# Patient Record
Sex: Male | Born: 1945 | Race: White | Hispanic: No | Marital: Married | Smoking: Current every day smoker
Health system: Southern US, Community
[De-identification: ages and names within clinical notes are randomized; demographics above are authoritative.]

## PROBLEM LIST (undated history)

## (undated) DIAGNOSIS — I2510 Atherosclerotic heart disease of native coronary artery without angina pectoris: Secondary | ICD-10-CM

## (undated) DIAGNOSIS — E1149 Type 2 diabetes mellitus with other diabetic neurological complication: Secondary | ICD-10-CM

## (undated) DIAGNOSIS — J301 Allergic rhinitis due to pollen: Secondary | ICD-10-CM

## (undated) DIAGNOSIS — J449 Chronic obstructive pulmonary disease, unspecified: Secondary | ICD-10-CM

## (undated) DIAGNOSIS — E785 Hyperlipidemia, unspecified: Secondary | ICD-10-CM

## (undated) DIAGNOSIS — I10 Essential (primary) hypertension: Secondary | ICD-10-CM

## (undated) DIAGNOSIS — K219 Gastro-esophageal reflux disease without esophagitis: Secondary | ICD-10-CM

## (undated) HISTORY — DX: Essential (primary) hypertension: I10

## (undated) HISTORY — DX: Hyperlipidemia, unspecified: E78.5

## (undated) HISTORY — PX: BACK SURGERY: SHX140

## (undated) HISTORY — DX: Chronic obstructive pulmonary disease, unspecified: J44.9

## (undated) HISTORY — DX: Atherosclerotic heart disease of native coronary artery without angina pectoris: I25.10

## (undated) HISTORY — DX: Allergic rhinitis due to pollen: J30.1

## (undated) HISTORY — DX: Type 2 diabetes mellitus with other diabetic neurological complication: E11.49

## (undated) HISTORY — DX: Gastro-esophageal reflux disease without esophagitis: K21.9

## (undated) HISTORY — PX: HERNIA REPAIR: SHX51

---

## 2011-04-23 LAB — HM DIABETES EYE EXAM

## 2012-04-05 ENCOUNTER — Ambulatory Visit (INDEPENDENT_AMBULATORY_CARE_PROVIDER_SITE_OTHER): Payer: Medicare Other | Admitting: Internal Medicine

## 2012-04-05 ENCOUNTER — Encounter: Payer: Self-pay | Admitting: Internal Medicine

## 2012-04-05 VITALS — BP 122/80 | HR 89 | Temp 98.0°F | Ht 66.0 in | Wt 203.0 lb

## 2012-04-05 DIAGNOSIS — F17200 Nicotine dependence, unspecified, uncomplicated: Secondary | ICD-10-CM

## 2012-04-05 DIAGNOSIS — J449 Chronic obstructive pulmonary disease, unspecified: Secondary | ICD-10-CM

## 2012-04-05 DIAGNOSIS — I2510 Atherosclerotic heart disease of native coronary artery without angina pectoris: Secondary | ICD-10-CM | POA: Insufficient documentation

## 2012-04-05 DIAGNOSIS — E1149 Type 2 diabetes mellitus with other diabetic neurological complication: Secondary | ICD-10-CM

## 2012-04-05 DIAGNOSIS — J301 Allergic rhinitis due to pollen: Secondary | ICD-10-CM | POA: Insufficient documentation

## 2012-04-05 DIAGNOSIS — E785 Hyperlipidemia, unspecified: Secondary | ICD-10-CM

## 2012-04-05 DIAGNOSIS — E1140 Type 2 diabetes mellitus with diabetic neuropathy, unspecified: Secondary | ICD-10-CM | POA: Insufficient documentation

## 2012-04-05 DIAGNOSIS — K219 Gastro-esophageal reflux disease without esophagitis: Secondary | ICD-10-CM

## 2012-04-05 DIAGNOSIS — I219 Acute myocardial infarction, unspecified: Secondary | ICD-10-CM

## 2012-04-05 DIAGNOSIS — I10 Essential (primary) hypertension: Secondary | ICD-10-CM

## 2012-04-05 LAB — CBC WITH DIFFERENTIAL/PLATELET
Basophils Absolute: 0.1 10*3/uL (ref 0.0–0.1)
Basophils Relative: 0.5 % (ref 0.0–3.0)
Eosinophils Absolute: 0.4 10*3/uL (ref 0.0–0.7)
HCT: 45.2 % (ref 39.0–52.0)
Hemoglobin: 15.5 g/dL (ref 13.0–17.0)
Lymphs Abs: 2.7 10*3/uL (ref 0.7–4.0)
MCHC: 34.2 g/dL (ref 30.0–36.0)
MCV: 95.2 fl (ref 78.0–100.0)
Monocytes Absolute: 0.9 10*3/uL (ref 0.1–1.0)
Neutro Abs: 8.7 10*3/uL — ABNORMAL HIGH (ref 1.4–7.7)
RBC: 4.75 Mil/uL (ref 4.22–5.81)
RDW: 13.2 % (ref 11.5–14.6)

## 2012-04-05 LAB — BASIC METABOLIC PANEL
CO2: 28 mEq/L (ref 19–32)
Chloride: 99 mEq/L (ref 96–112)
Glucose, Bld: 193 mg/dL — ABNORMAL HIGH (ref 70–99)
Sodium: 135 mEq/L (ref 135–145)

## 2012-04-05 LAB — LDL CHOLESTEROL, DIRECT: Direct LDL: 78.8 mg/dL

## 2012-04-05 LAB — HEPATIC FUNCTION PANEL
ALT: 42 U/L (ref 0–53)
Albumin: 4.3 g/dL (ref 3.5–5.2)
Bilirubin, Direct: 0 mg/dL (ref 0.0–0.3)
Total Protein: 8.3 g/dL (ref 6.0–8.3)

## 2012-04-05 LAB — LIPID PANEL
Cholesterol: 150 mg/dL (ref 0–200)
HDL: 32.2 mg/dL — ABNORMAL LOW (ref >39.00)
Triglycerides: 281 mg/dL — ABNORMAL HIGH (ref 0.0–149.0)

## 2012-04-05 LAB — HM DIABETES FOOT EXAM

## 2012-04-05 NOTE — Assessment & Plan Note (Signed)
Counseled about 5 minutes Gave info on 1-800 QUIT NOW

## 2012-04-05 NOTE — Progress Notes (Signed)
Subjective:    Patient ID: Howard Dixon, male    DOB: [birth year], 67 y.o.   MRN: [id number]  HPI Long ago former patient Last seen ~2004 [facility] Dr Anthoney Harada  Has diabetes for 15 years or so Insulin and glipizide added in past few years Checks sugars every morning Highly variable-- 140's- 200 (rarely over 200) Has had a few hypoglycemic reaction but not recently Not as active in the winter--tends to be higher then Not regular going to the gym Intermittent tingling in hands and feet---and some numbness  HTN at least 15 years On meds and doing okay with this  On ranitidine Satisfied with it for his acid reflux  COPD diagnosis Recent prescription for albuterol Doesn't feel he can quit---thinks he smokes more every time he tries to quit Has decreased to 1PPD Info given  Allergies to pollen Not clearly great response to his current meds  Cardiac cath in 2011 Told he has 100% blockage somewhere but not in his medical records Sees cardiologist Is on statin  Current Outpatient Prescriptions on File Prior to Visit  Medication Sig Dispense Refill  . albuterol (PROVENTIL HFA;VENTOLIN HFA) 108 (90 BASE) MCG/ACT inhaler Inhale 2 puffs into the lungs 4 (four) times daily as needed.      . flunisolide (NASAREL) 29 MCG/ACT (0.025%) nasal spray Place 2 sprays into the nose 2 (two) times daily. Dose is for each nostril.      Marland Kitchen glipiZIDE (GLUCOTROL) 10 MG tablet Take 10 mg by mouth 2 (two) times daily before a meal.      . hydrochlorothiazide (HYDRODIURIL) 25 MG tablet Take 12.5 mg by mouth daily.      . insulin glargine (LANTUS) 100 UNIT/ML injection Inject 20 Units into the skin at bedtime.      Marland Kitchen lisinopril (PRINIVIL,ZESTRIL) 40 MG tablet Take 20 mg by mouth daily.      Marland Kitchen loratadine (CLARITIN) 10 MG tablet Take 10 mg by mouth daily.      . metFORMIN (GLUCOPHAGE) 1000 MG tablet Take 1,000 mg by mouth 2 (two) times daily with a meal.      . metoprolol (LOPRESSOR)  50 MG tablet Take 25 mg by mouth 2 (two) times daily.      . ranitidine (ZANTAC) 150 MG capsule Take 150 mg by mouth 2 (two) times daily.      . simvastatin (ZOCOR) 40 MG tablet Take 20 mg by mouth every evening.        Allergies  Allergen Reactions  . Sulfa Antibiotics Rash    Past Medical History  Diagnosis Date  . Allergic rhinitis due to pollen   . Coronary artery disease, occlusive   . Hyperlipidemia   . Hypertension   . Type II or unspecified type diabetes mellitus with neurological manifestations, not stated as uncontrolled(250.60)   . GERD (gastroesophageal reflux disease)     No past surgical history on file.  Family History  Problem Relation Age of Onset  . Diabetes Mother   . Hypertension Mother   . Diabetes Father   . Heart disease Father   . Hypertension Father   . Diabetes Sister   . Hypertension Sister   . Diabetes Brother   . Heart disease Brother   . Hypertension Brother   . Cancer Neg Hx   . Diabetes Brother   . Hypertension Brother   . Diabetes Brother   . Heart disease Brother   . Kidney disease Brother   .  Hypertension Brother     History   Social History  . Marital Status: Married    Spouse Name: N/A    Number of Children: N/A  . Years of Education: N/A   Occupational History  . Truck driver--retired due to medical problems    Social History Main Topics  . Smoking status: Current Every Day Smoker -- 1.0 packs/day    Types: Cigarettes    Start date: 03/22/1962  . Smokeless tobacco: Never Used  . Alcohol Use: No  . Drug Use: No  . Sexually Active: Not on file   Other Topics Concern  . Not on file   Social History Narrative   1 son--in prison for a long timeNo living willNot sure about health care POA---having some marital issues   Review of Systems  Constitutional: Negative for fatigue and unexpected weight change.       Wears seat belt  HENT: Positive for congestion and rhinorrhea. Negative for hearing loss and tinnitus.         Has dentures but doesn't wear them  Eyes: Negative for visual disturbance.       Reading glasses  Respiratory: Positive for cough, shortness of breath and wheezing.   Cardiovascular: Negative for chest pain, palpitations and leg swelling.  Gastrointestinal: Negative for nausea, vomiting and abdominal pain.       Heartburn if he eats greasy food  Genitourinary: Negative for frequency and difficulty urinating.  Musculoskeletal: Positive for back pain. Negative for joint swelling and arthralgias.       Back is better if he is regularly active Naproxen prn  Skin: Negative for rash.       Dry skin  Neurological: Positive for dizziness, light-headedness and numbness. Negative for syncope, weakness and headaches.       Occ dizziness  Hematological: Negative for adenopathy. Bruises/bleeds easily.       Bruises easy on the aspirin  Psychiatric/Behavioral: Positive for sleep disturbance and dysphoric mood. The patient is not nervous/anxious.        Chronic sleep problems--stays up late, naps in day Lots of stress--some intermittent mood issues Wife and step daughter will "push" him       Objective:   Physical Exam  Constitutional: He appears well-developed and well-nourished. No distress.  HENT:  Mouth/Throat: Oropharynx is clear and moist. No oropharyngeal exudate.  Eyes: Conjunctivae normal are normal. Pupils are equal, round, and reactive to light.  Neck: Normal range of motion. Neck supple. No thyromegaly present.  Cardiovascular: Normal rate, regular rhythm, normal heart sounds and intact distal pulses.  Exam reveals no gallop.   No murmur heard. Pulmonary/Chest: Effort normal. No respiratory distress. He has wheezes. He has no rales.       Decreased breath sounds with slight exp wheezing  Not tight though  Abdominal: Soft. There is no tenderness.  Musculoskeletal: He exhibits no edema and no tenderness.  Lymphadenopathy:    He has no cervical adenopathy.  Neurological:        Normal fine touch sensation in plantar feet  Skin: No rash noted. No erythema.       No foot lesions  Psychiatric: He has a normal mood and affect. His behavior is normal.          Assessment & Plan:

## 2012-04-05 NOTE — Assessment & Plan Note (Signed)
Must stop smoking Hasn't had success

## 2012-04-05 NOTE — Assessment & Plan Note (Signed)
Does okay with the ranitidine 

## 2012-04-05 NOTE — Patient Instructions (Signed)
Please get records from [facility]---all tests, labs, notes from 2010 on

## 2012-04-05 NOTE — Assessment & Plan Note (Signed)
Had 100% blockage in 1 vessel [facility]'t think [facility] being [facility] about all [facility] tests Wonders if he should have another stress test  I will check their records Consider cardiology evaluation No symptoms now though Discussed cigarette cessation

## 2012-04-05 NOTE — Assessment & Plan Note (Signed)
Not clear if his control is acceptable Has had some low sugars but doesn't sound overly brittle If >9% will increase insulin Discussed lifestyle

## 2012-04-05 NOTE — Assessment & Plan Note (Signed)
BP Readings from Last 3 Encounters:  04/05/12 122/80   Good control No changes

## 2012-04-05 NOTE — Assessment & Plan Note (Signed)
On statin Will check labs 

## 2012-04-06 ENCOUNTER — Encounter: Payer: Self-pay | Admitting: *Deleted

## 2012-05-16 ENCOUNTER — Encounter: Payer: Self-pay | Admitting: Internal Medicine

## 2012-07-04 ENCOUNTER — Encounter: Payer: Self-pay | Admitting: *Deleted

## 2012-07-04 ENCOUNTER — Encounter: Payer: Self-pay | Admitting: Internal Medicine

## 2012-07-04 ENCOUNTER — Ambulatory Visit (INDEPENDENT_AMBULATORY_CARE_PROVIDER_SITE_OTHER): Payer: Medicare Other | Admitting: Internal Medicine

## 2012-07-04 VITALS — BP 118/70 | HR 85 | Temp 98.0°F | Wt 198.0 lb

## 2012-07-04 DIAGNOSIS — E1149 Type 2 diabetes mellitus with other diabetic neurological complication: Secondary | ICD-10-CM

## 2012-07-04 DIAGNOSIS — I219 Acute myocardial infarction, unspecified: Secondary | ICD-10-CM

## 2012-07-04 DIAGNOSIS — I2510 Atherosclerotic heart disease of native coronary artery without angina pectoris: Secondary | ICD-10-CM

## 2012-07-04 DIAGNOSIS — J449 Chronic obstructive pulmonary disease, unspecified: Secondary | ICD-10-CM

## 2012-07-04 DIAGNOSIS — E785 Hyperlipidemia, unspecified: Secondary | ICD-10-CM

## 2012-07-04 LAB — HEMOGLOBIN A1C: Hgb A1c MFr Bld: 7.9 % — ABNORMAL HIGH (ref 4.6–6.5)

## 2012-07-04 NOTE — Assessment & Plan Note (Signed)
Seems to be stable Discussed getting back to exercising

## 2012-07-04 NOTE — Assessment & Plan Note (Signed)
Statin changed at the [facility]

## 2012-07-04 NOTE — Assessment & Plan Note (Signed)
Lab Results  Component Value Date   HGBA1C 8.4* 04/05/2012   May need increase in lantus if any worse

## 2012-07-04 NOTE — Assessment & Plan Note (Signed)
Chronic cough No dyspnea but not pushing it Just not able to stop smoking

## 2012-07-04 NOTE — Patient Instructions (Signed)
Please get back to the gym. Please eat lunch daily Don't go to sleep in the evening till close to 10PM---this should improve your sleep pattern. It is also good to get up at a consistent time (like 5-6AM if you go to sleep at 10PM)

## 2012-07-04 NOTE — Progress Notes (Signed)
Subjective:    Patient ID: Howard Dixon, male    DOB: July 02, 1945, 67 y.o.   MRN: [id number]  HPI Here for follow up  Did have simvastatin changed to [facility]orvastatin at [facility] Concern about his records---[facility] took [facility] 30% disability from the CAD. Reviewed that he does have CAD  Hasn't gotten back to the gym Weight is down a few pounds  Checks sugars every morning Usually 150-180 Higher when there is temperature variability Some hypoglycemic reactions---down at 60 or 75-----has mild sweats, shakes and weakness (usually during the afternoon but he doesn't really eat lunch------discussed this)  Breathing has been okay Still with regular cough Still smoking--not ready to stop  Current Outpatient Prescriptions on File Prior to Visit  Medication Sig Dispense Refill  . albuterol (PROVENTIL HFA;VENTOLIN HFA) 108 (90 BASE) MCG/ACT inhaler Inhale 2 puffs into the lungs 4 (four) times daily as needed.      Marland Kitchen aspirin 81 MG tablet Take 81 mg by mouth daily.      . BD ULTRA-FINE LANCETS lancets 1 each by Other route. Use as instructed      . Cholecalciferol (VITAMIN D-3) 1000 UNITS CAPS Take 1 capsule by mouth daily.      . flunisolide (NASAREL) 29 MCG/ACT (0.025%) nasal spray Place 2 sprays into the nose 2 (two) times daily. Dose is for each nostril.      Marland Kitchen glipiZIDE (GLUCOTROL) 10 MG tablet Take 10 mg by mouth 2 (two) times daily before a meal.      . glucose blood (PRECISION XTRA TEST STRIPS) test strip 1 each by Other route as needed. Use as instructed      . hydrocerin (EUCERIN) CREA Apply 1 application topically 2 (two) times daily.      . hydrochlorothiazide (HYDRODIURIL) 25 MG tablet Take 12.5 mg by mouth daily.      . insulin glargine (LANTUS) 100 UNIT/ML injection Inject 20 Units into the skin at bedtime.      . Insulin Syringe-Needle U-100 (INSULIN SYRINGE .5CC/31GX5/16") 31G X 5/16" 0.5 ML MISC by Does not apply route.      Marland Kitchen lisinopril (PRINIVIL,ZESTRIL) 40 MG tablet Take 20 mg by  mouth daily.      Marland Kitchen loratadine (CLARITIN) 10 MG tablet Take 10 mg by mouth daily.      . metFORMIN (GLUCOPHAGE) 1000 MG tablet Take 1,000 mg by mouth 2 (two) times daily with a meal.      . metoprolol (LOPRESSOR) 50 MG tablet Take 25 mg by mouth 2 (two) times daily.      . naproxen (NAPROSYN) 500 MG tablet Take 500 mg by mouth 2 (two) times daily with a meal.      . ranitidine (ZANTAC) 150 MG capsule Take 150 mg by mouth 2 (two) times daily.       No current facility-administered medications on file prior to visit.    Allergies  Allergen Reactions  . Sulfa Antibiotics Rash    Past Medical History  Diagnosis Date  . Allergic rhinitis due to pollen   . Coronary artery disease, occlusive   . Hyperlipidemia   . Hypertension   . Type II or unspecified type diabetes mellitus with neurological manifestations, not stated as uncontrolled(250.60)   . GERD (gastroesophageal reflux disease)   . COPD (chronic obstructive pulmonary disease)     No past surgical history on file.  Family History  Problem Relation Age of Onset  . Diabetes Mother   . Hypertension Mother   .  Diabetes Father   . Heart disease Father   . Hypertension Father   . Diabetes Sister   . Hypertension Sister   . Diabetes Brother   . Heart disease Brother   . Hypertension Brother   . Cancer Neg Hx   . Diabetes Brother   . Hypertension Brother   . Diabetes Brother   . Heart disease Brother   . Kidney disease Brother   . Hypertension Brother     History   Social History  . Marital Status: Married    Spouse Name: N/A    Number of Children: N/A  . Years of Education: N/A   Occupational History  . Truck driver--retired due to medical problems    Social History Main Topics  . Smoking status: Current Every Day Smoker -- 1.00 packs/day    Types: Cigarettes    Start date: 03/22/1962  . Smokeless tobacco: Never Used  . Alcohol Use: No  . Drug Use: No  . Sexually Active: Not on file   Other Topics Concern   . Not on file   Social History Narrative   1 son--in prison for a long time      No living will   Not sure about health care POA---having some marital issues     Review of Systems Not eating as much so weight down a few pounds Not sleeping well-- "sleeping crazy since being out of work". Will nap in day occ "needles" like sensation in ankles--no sig pain    Objective:   Physical Exam  Constitutional: He appears well-developed and well-nourished. No distress.  Neck: Normal range of motion. Neck supple. No thyromegaly present.  Cardiovascular: Normal rate, regular rhythm, normal heart sounds and intact distal pulses.  Exam reveals no gallop.   No murmur heard. Pulmonary/Chest: Effort normal. No respiratory distress. He has wheezes. He has no rales.  Decreased breath sounds Not tight Slight exp moan  Musculoskeletal: He exhibits no edema and no tenderness.  Lymphadenopathy:    He has no cervical adenopathy.  Skin:  No foot lesions  Psychiatric: He has a normal mood and affect. His behavior is normal.          Assessment & Plan:

## 2013-01-03 ENCOUNTER — Ambulatory Visit: Payer: Medicare Other | Admitting: Internal Medicine

## 2013-01-16 ENCOUNTER — Encounter: Payer: Self-pay | Admitting: Internal Medicine

## 2013-01-16 ENCOUNTER — Ambulatory Visit (INDEPENDENT_AMBULATORY_CARE_PROVIDER_SITE_OTHER): Payer: Medicare Other | Admitting: Internal Medicine

## 2013-01-16 VITALS — BP 122/80 | HR 66 | Temp 97.9°F | Wt 189.0 lb

## 2013-01-16 DIAGNOSIS — I2510 Atherosclerotic heart disease of native coronary artery without angina pectoris: Secondary | ICD-10-CM

## 2013-01-16 DIAGNOSIS — I219 Acute myocardial infarction, unspecified: Secondary | ICD-10-CM

## 2013-01-16 DIAGNOSIS — E1149 Type 2 diabetes mellitus with other diabetic neurological complication: Secondary | ICD-10-CM

## 2013-01-16 DIAGNOSIS — J449 Chronic obstructive pulmonary disease, unspecified: Secondary | ICD-10-CM

## 2013-01-16 DIAGNOSIS — I10 Essential (primary) hypertension: Secondary | ICD-10-CM

## 2013-01-16 LAB — HEMOGLOBIN A1C: Hgb A1c MFr Bld: 8.7 % — ABNORMAL HIGH (ref 4.6–6.5)

## 2013-01-16 NOTE — Assessment & Plan Note (Signed)
Good exercise tolerance No changes On higher beta blocker now

## 2013-01-16 NOTE — Assessment & Plan Note (Signed)
BP Readings from Last 3 Encounters:  01/16/13 122/80  07/04/12 118/70  04/05/12 122/80   Good control

## 2013-01-16 NOTE — Assessment & Plan Note (Signed)
Mild symptoms Just not willing to stop the cigarettes

## 2013-01-16 NOTE — Progress Notes (Signed)
Subjective:    Patient ID: Howard Dixon, male    DOB: Jul 30, 1945, 67 y.o.   MRN: [id number]  HPI Here for check up  Saw cardiologist at [facility] about 2 weeks ago---raised metoprolol to 100mg  daily No chest pain No palpitations Does note easier DOE--like 2-3 hours of yard work (just takes brief rests but then exhausted) He wasn't sure why she raised it  Checks sugars intermittently Inconsistent due to recent move---brand new home (new trailer) No hypoglycemic reactions lately No sores in feet but occasional pain-- feeling of "needles being stuck in there" Sugars 150-170 Fairly consistent with his meds  Some cough Occ wheezing---not too bad Discussed cigarettes--just not willing to stop  Stomach has been okay On the ranitidine  Current Outpatient Prescriptions on File Prior to Visit  Medication Sig Dispense Refill  . albuterol (PROVENTIL HFA;VENTOLIN HFA) 108 (90 BASE) MCG/ACT inhaler Inhale 2 puffs into the lungs 4 (four) times daily as needed.      Marland Kitchen aspirin 81 MG tablet Take 81 mg by mouth daily.      Marland Kitchen atorvastatin (LIPITOR) 10 MG tablet NOT SURE OF DOSE      . BD ULTRA-FINE LANCETS lancets 1 each by Other route. Use as instructed      . Cholecalciferol (VITAMIN D-3) 1000 UNITS CAPS Take 1 capsule by mouth daily.      . flunisolide (NASAREL) 29 MCG/ACT (0.025%) nasal spray Place 2 sprays into the nose 2 (two) times daily. Dose is for each nostril.      Marland Kitchen glipiZIDE (GLUCOTROL) 10 MG tablet Take 10 mg by mouth 2 (two) times daily before a meal.      . glucose blood (PRECISION XTRA TEST STRIPS) test strip 1 each by Other route as needed. Use as instructed      . hydrocerin (EUCERIN) CREA Apply 1 application topically 2 (two) times daily.      . hydrochlorothiazide (HYDRODIURIL) 25 MG tablet Take 12.5 mg by mouth daily.      . insulin glargine (LANTUS) 100 UNIT/ML injection Inject 20 Units into the skin at bedtime.      . Insulin Syringe-Needle U-100 (INSULIN SYRINGE .5CC/31GX5/16")  31G X 5/16" 0.5 ML MISC by Does not apply route.      Marland Kitchen lisinopril (PRINIVIL,ZESTRIL) 40 MG tablet Take 20 mg by mouth daily.      Marland Kitchen loratadine (CLARITIN) 10 MG tablet Take 10 mg by mouth daily.      . metFORMIN (GLUCOPHAGE) 1000 MG tablet Take 1,000 mg by mouth 2 (two) times daily with a meal.      . metoprolol (LOPRESSOR) 50 MG tablet Take 50 mg by mouth 2 (two) times daily.       . naproxen (NAPROSYN) 500 MG tablet Take 500 mg by mouth 2 (two) times daily with a meal.      . ranitidine (ZANTAC) 150 MG capsule Take 150 mg by mouth 2 (two) times daily.       No current facility-administered medications on file prior to visit.    Allergies  Allergen Reactions  . Sulfa Antibiotics Rash    Past Medical History  Diagnosis Date  . Allergic rhinitis due to pollen   . Coronary artery disease, occlusive   . Hyperlipidemia   . Hypertension   . Type II or unspecified type diabetes mellitus with neurological manifestations, not stated as uncontrolled(250.60)   . GERD (gastroesophageal reflux disease)   . COPD (chronic obstructive pulmonary disease)     No  past surgical history on file.  Family History  Problem Relation Age of Onset  . Diabetes Mother   . Hypertension Mother   . Diabetes Father   . Heart disease Father   . Hypertension Father   . Diabetes Sister   . Hypertension Sister   . Diabetes Brother   . Heart disease Brother   . Hypertension Brother   . Cancer Neg Hx   . Diabetes Brother   . Hypertension Brother   . Diabetes Brother   . Heart disease Brother   . Kidney disease Brother   . Hypertension Brother     History   Social History  . Marital Status: Married    Spouse Name: N/A    Number of Children: N/A  . Years of Education: N/A   Occupational History  . Truck driver--retired due to medical problems    Social History Main Topics  . Smoking status: Current Every Day Smoker -- 1.00 packs/day    Types: Cigarettes    Start date: 03/22/1962  . Smokeless  tobacco: Never Used  . Alcohol Use: No  . Drug Use: No  . Sexual Activity: Not on file   Other Topics Concern  . Not on file   Social History Narrative   1 son--in prison for a long time      No living will   Not sure about health care POA---having some marital issues   Review of Systems Weight is down 9# Chronic sleep problems--will nap in the day Appetite is okay    Objective:   Physical Exam  Constitutional: He appears well-developed and well-nourished. No distress.  Neck: Normal range of motion. Neck supple. No thyromegaly present.  Cardiovascular: Normal rate, regular rhythm, normal heart sounds and intact distal pulses.  Exam reveals no gallop.   No murmur heard. Pulmonary/Chest: Effort normal. No respiratory distress. He has no wheezes. He has no rales.  Decreased breath sounds but clear  Abdominal: Soft. There is no tenderness.  Musculoskeletal: He exhibits no edema and no tenderness.  Lymphadenopathy:    He has no cervical adenopathy.  Skin: No rash noted. No erythema.  No foot lesions  Psychiatric: He has a normal mood and affect. His behavior is normal.          Assessment & Plan:

## 2013-01-16 NOTE — Assessment & Plan Note (Signed)
Will recheck control Doing okay

## 2013-01-18 ENCOUNTER — Encounter: Payer: Self-pay | Admitting: *Deleted

## 2013-02-14 ENCOUNTER — Other Ambulatory Visit: Payer: Medicare Other

## 2013-07-19 ENCOUNTER — Encounter: Payer: Medicare Other | Admitting: Internal Medicine

## 2013-10-01 ENCOUNTER — Telehealth: Payer: Self-pay | Admitting: *Deleted

## 2013-10-01 NOTE — Telephone Encounter (Signed)
Lm on pts vm requesting a call back to schedule DIABETIC BUND[facility] LAB-needing LDL and A1C 

## 2014-01-02 ENCOUNTER — Telehealth: Payer: Self-pay

## 2014-01-02 NOTE — Telephone Encounter (Signed)
Wrong numbers listed.

## 2015-03-31 LAB — FECAL OCCULT BLOOD, GUAIAC: FECAL OCCULT BLD: NEGATIVE

## 2015-04-28 ENCOUNTER — Encounter: Payer: Self-pay | Admitting: Internal Medicine

## 2015-05-11 ENCOUNTER — Emergency Department (HOSPITAL_COMMUNITY)
Admission: EM | Admit: 2015-05-11 | Discharge: 2015-05-11 | Disposition: A | Discharge status: Home / Self Care | Payer: Medicare Other | Attending: Emergency Medicine | Admitting: Emergency Medicine

## 2015-05-11 ENCOUNTER — Encounter (HOSPITAL_COMMUNITY): Payer: Self-pay | Admitting: *Deleted

## 2015-05-11 ENCOUNTER — Emergency Department (HOSPITAL_COMMUNITY): Payer: Medicare Other

## 2015-05-11 DIAGNOSIS — I2510 Atherosclerotic heart disease of native coronary artery without angina pectoris: Secondary | ICD-10-CM | POA: Insufficient documentation

## 2015-05-11 DIAGNOSIS — J449 Chronic obstructive pulmonary disease, unspecified: Secondary | ICD-10-CM | POA: Diagnosis not present

## 2015-05-11 DIAGNOSIS — Z7982 Long term (current) use of aspirin: Secondary | ICD-10-CM | POA: Insufficient documentation

## 2015-05-11 DIAGNOSIS — M25552 Pain in left hip: Secondary | ICD-10-CM | POA: Insufficient documentation

## 2015-05-11 DIAGNOSIS — Z794 Long term (current) use of insulin: Secondary | ICD-10-CM | POA: Insufficient documentation

## 2015-05-11 DIAGNOSIS — R1032 Left lower quadrant pain: Secondary | ICD-10-CM | POA: Insufficient documentation

## 2015-05-11 DIAGNOSIS — Z79899 Other long term (current) drug therapy: Secondary | ICD-10-CM | POA: Insufficient documentation

## 2015-05-11 DIAGNOSIS — F17210 Nicotine dependence, cigarettes, uncomplicated: Secondary | ICD-10-CM | POA: Diagnosis not present

## 2015-05-11 DIAGNOSIS — M545 Low back pain: Secondary | ICD-10-CM | POA: Diagnosis not present

## 2015-05-11 DIAGNOSIS — I10 Essential (primary) hypertension: Secondary | ICD-10-CM | POA: Insufficient documentation

## 2015-05-11 DIAGNOSIS — E1149 Type 2 diabetes mellitus with other diabetic neurological complication: Secondary | ICD-10-CM | POA: Diagnosis not present

## 2015-05-11 DIAGNOSIS — G8929 Other chronic pain: Secondary | ICD-10-CM | POA: Insufficient documentation

## 2015-05-11 DIAGNOSIS — Z7984 Long term (current) use of oral hypoglycemic drugs: Secondary | ICD-10-CM | POA: Diagnosis not present

## 2015-05-11 DIAGNOSIS — K219 Gastro-esophageal reflux disease without esophagitis: Secondary | ICD-10-CM | POA: Insufficient documentation

## 2015-05-11 DIAGNOSIS — E785 Hyperlipidemia, unspecified: Secondary | ICD-10-CM | POA: Diagnosis not present

## 2015-05-11 DIAGNOSIS — Z791 Long term (current) use of non-steroidal anti-inflammatories (NSAID): Secondary | ICD-10-CM | POA: Diagnosis not present

## 2015-05-11 DIAGNOSIS — M79605 Pain in left leg: Secondary | ICD-10-CM | POA: Diagnosis present

## 2015-05-11 LAB — URINE MICROSCOPIC-ADD ON
BACTERIA UA: NONE SEEN
SQUAMOUS EPITHELIAL / LPF: NONE SEEN
WBC, UA: NONE SEEN WBC/hpf (ref 0–5)

## 2015-05-11 LAB — URINALYSIS, ROUTINE W REFLEX MICROSCOPIC
Bilirubin Urine: NEGATIVE
Glucose, UA: NEGATIVE mg/dL
Ketones, ur: NEGATIVE mg/dL
LEUKOCYTES UA: NEGATIVE
NITRITE: NEGATIVE
PROTEIN: NEGATIVE mg/dL
Specific Gravity, Urine: 1.02 (ref 1.005–1.030)
pH: 5 (ref 5.0–8.0)

## 2015-05-11 MED ORDER — ACETAMINOPHEN 500 MG PO TABS: 1000.0000 mg | ORAL_TABLET | Freq: Once | ORAL | Status: DC

## 2015-05-11 NOTE — Discharge Instructions (Signed)
You may take Tylenol (acetaminophen) 1000 milligrams every 6 hours as needed for pain. Please be careful because Percocet has oxycodone and Tylenol in it. You should not take more than 4000 mg Tylenol in a 24-hour period.  Urine today shows small blood but no other sign of infection. Your x-ray showed degenerative changes. I recommend you follow-up with your primary care physician for further management for your pain.   Hip Pain Your hip is the joint between your upper legs and your lower pelvis. The bones, cartilage, tendons, and muscles of your hip joint perform a lot of work each day supporting your body weight and allowing you to move around. Hip pain can range from a minor ache to severe pain in one or both of your hips. Pain may be felt on the inside of the hip joint near the groin, or the outside near the buttocks and upper thigh. You may have swelling or stiffness as well.  HOME CARE INSTRUCTIONS   Take medicines only as directed by your health care provider.  Apply ice to the injured area:  Put ice in a plastic bag.  Place a towel between your skin and the bag.  Leave the ice on for 15-20 minutes at a time, 3-4 times a day.  Keep your leg raised (elevated) when possible to lessen swelling.  Avoid activities that cause pain.  Follow specific exercises as directed by your health care provider.  Sleep with a pillow between your legs on your most comfortable side.  Record how often you have hip pain, the location of the pain, and what it feels like. SEEK MEDICAL CARE IF:   You are unable to put weight on your leg.  Your hip is red or swollen or very tender to touch.  Your pain or swelling continues or worsens after 1 week.  You have increasing difficulty walking.  You have a fever. SEEK IMMEDIATE MEDICAL CARE IF:   You have fallen.  You have a sudden increase in pain and swelling in your hip. MAKE SURE YOU:   Understand these instructions.  Will watch your  condition.  Will get help right away if you are not doing well or get worse.   This information is not intended to replace advice given to you by your health care provider. Make sure you discuss any questions you have with your health care provider.   Document Released: 08/26/2009 Document Revised: 03/29/2014 Document Reviewed: 11/02/2012 Elsevier Interactive Patient Education [employer]

## 2015-05-11 NOTE — ED Notes (Signed)
Pt c/o left groin, left leg, left hip and lower back pain that has been ongoing since the first of the year, pain is worse with sitting for long periods of time, pt states that he is just tired of the pain,

## 2015-05-11 NOTE — ED Provider Notes (Signed)
TIME SEEN: 5:00  CHIEF COMPLAINT: left leg pain, groin pain, back pain  HPI: Pt is a 70 y.o. male history of hypertension, diabetes, hyperlipidemia, COPD who presents to the emergency department with 2-3 months of left lower back pain, left hip and left groin pain. Pain worse after sitting for long periods of time. He has been on Percocet with minimal relief. States that Tylenol however provides good relief for his pain. No history of injury. No numbness, tingling or focal weakness. No dysuria, hematuria. No testicular pain or swelling. States she was seen at the [facility] several weeks ago and had an ultrasound of his "groin" which was normal. No noted history of PE or DVT. No lower extreme swelling or calf tenderness. He denies penile discharge. No history of STD and has not been sexually active in many years.  ROS: See HPI Constitutional: no fever  Eyes: no drainage  ENT: no runny nose   Cardiovascular:  no chest pain  Resp: no SOB  GI: no vomiting GU: no dysuria Integumentary: no rash  Allergy: no hives  Musculoskeletal: no leg swelling  Neurological: no slurred speech ROS otherwise negative  PAST MEDICAL HISTORY/PAST SURGICAL HISTORY:  Past Medical History  Diagnosis Date  . Allergic rhinitis due to pollen   . Coronary artery disease, occlusive   . Hyperlipidemia   . Hypertension   . Type II or unspecified type diabetes mellitus with neurological manifestations, not stated as uncontrolled   . GERD (gastroesophageal reflux disease)   . COPD (chronic obstructive pulmonary disease) (HCC)     MEDICATIONS:  Prior to Admission medications   Medication Sig Start Date End Date Taking? Authorizing Provider  albuterol (PROVENTIL HFA;VENTOLIN HFA) 108 (90 BASE) MCG/ACT inhaler Inhale 2 puffs into the lungs 4 (four) times daily as needed.    Historical Provider, MD  aspirin 81 MG tablet Take 81 mg by mouth daily.    Historical Provider, MD  atorvastatin (LIPITOR) 10 MG tablet NOT SURE  OF DOSE    Historical Provider, MD  BD ULTRA-FINE LANCETS lancets 1 each by Other route. Use as instructed    Historical Provider, MD  Cholecalciferol (VITAMIN D-3) 1000 UNITS CAPS Take 1 capsule by mouth daily.    Historical Provider, MD  flunisolide (NASAREL) 29 MCG/ACT (0.025%) nasal spray Place 2 sprays into the nose 2 (two) times daily. Dose is for each nostril.    Historical Provider, MD  glipiZIDE (GLUCOTROL) 10 MG tablet Take 10 mg by mouth 2 (two) times daily before a meal.    Historical Provider, MD  glucose blood (PRECISION XTRA TEST STRIPS) test strip 1 each by Other route as needed. Use as instructed    Historical Provider, MD  hydrocerin (EUCERIN) CREA Apply 1 application topically 2 (two) times daily.    Historical Provider, MD  hydrochlorothiazide (HYDRODIURIL) 25 MG tablet Take 12.5 mg by mouth daily.    Historical Provider, MD  insulin glargine (LANTUS) 100 UNIT/ML injection Inject 20 Units into the skin at bedtime.    Historical Provider, MD  Insulin Syringe-Needle U-100 (INSULIN SYRINGE .5CC/31GX5/16") 31G X 5/16" 0.5 ML MISC by Does not apply route.    Historical Provider, MD  lisinopril (PRINIVIL,ZESTRIL) 40 MG tablet Take 20 mg by mouth daily.    Historical Provider, MD  loratadine (CLARITIN) 10 MG tablet Take 10 mg by mouth daily.    Historical Provider, MD  metFORMIN (GLUCOPHAGE) 1000 MG tablet Take 1,000 mg by mouth 2 (two) times daily with a meal.  Historical Provider, MD  metoprolol (LOPRESSOR) 50 MG tablet Take 50 mg by mouth 2 (two) times daily.     Historical Provider, MD  naproxen (NAPROSYN) 500 MG tablet Take 500 mg by mouth 2 (two) times daily with a meal.    Historical Provider, MD  ranitidine (ZANTAC) 150 MG capsule Take 150 mg by mouth 2 (two) times daily.    Historical Provider, MD    ALLERGIES:  Allergies  Allergen Reactions  . Sulfa Antibiotics Rash    SOCIAL HISTORY:  Social History  Substance Use Topics  . Smoking status: Current Every Day Smoker  -- 1.00 packs/day    Types: Cigarettes    Start date: 03/22/1962  . Smokeless tobacco: Never Used  . Alcohol Use: No    FAMILY HISTORY: Family History  Problem Relation Age of Onset  . Diabetes Mother   . Hypertension Mother   . Diabetes Father   . Heart disease Father   . Hypertension Father   . Diabetes Sister   . Hypertension Sister   . Diabetes Brother   . Heart disease Brother   . Hypertension Brother   . Cancer Neg Hx   . Diabetes Brother   . Hypertension Brother   . Diabetes Brother   . Heart disease Brother   . Kidney disease Brother   . Hypertension Brother     EXAM: BP 130/93 mmHg  Pulse 86  Temp(Src) 97.7 F (36.5 C) (Oral)  Resp 22  Ht  (1.676 m)  Wt 196 lb (88.905 kg)  BMI 31.65 kg/m2  SpO2 99% CONSTITUTIONAL: Alert and oriented and responds appropriately to questions. Well-appearing; well-nourished,elderly and in no distress, afebrile HEAD: Normocephalic EYES: Conjunctivae clear, PERRL ENT: normal nose; no rhinorrhea; moist mucous membranes; pharynx without lesions noted NECK: Supple, no meningismus, no LAD  CARD: RRR; S1 and S2 appreciated; no murmurs, no clicks, no rubs, no gallops RESP: Normal chest excursion without splinting or tachypnea; breath sounds clear and equal bilaterally; no wheezes, no rhonchi, no rales, no hypoxia or respiratory distress, speaking full sentences ABD/GI: Normal bowel sounds; non-distended; soft, non-tender, no rebound, no guarding, no peritoneal signs GU:  Normal external genitalia, circumcised male, normal penile shaft, no blood or discharge at the urethral meatus, no testicular masses or tenderness on exam, no scrotal masses or swelling, no hernias appreciated, 2+ femoral pulses bilaterally; no perineal erythema, warmth, subcutaneous air or crepitus; no high riding testicle, normal bilateral cremasteric reflex; no tenderness in the inguinal area, no inguinal lymphadenopathy appreciated BACK:  The back appears normal  and is minimally tender to palpation over the left lumbar paraspinal musculature without step-off or deformity, there is no CVA tenderness EXT: patient is tender to palpation of the left lateral hip has full range of motion in this joint and leg length discrepancy. No joint effusion, erythema or warmth. No bony deformity.  Normal ROM in all joints; otherwise extremities are non-tender to palpation; no edema; normal capillary refill; no cyanosis, no calf tenderness or swelling    SKIN: Normal color for age and race; warm NEURO: Moves all extremities equally, sensation to light touch intact diffusely, cranial nerves II through XII intact; normal gait, no saddle anesthesia PSYCH: The patient's mood and manner are appropriate. Grooming and personal hygiene are appropriate.  MEDICAL DECISION MAKING: Pt here with left hip and left lower back pain. Suspect this is musculoskeletal in nature, arthritic changes. Doubt spinal stenosis, cauda equina, epidural abscess or hematoma, transverse myelitis, discitis. His GU exam  is normal. Nothing to suggest testicular torsion, epididymitis. No history of STDs and he is not sexually active. Will obtain urinalysis as patient is a poor historian but doubt that this is a UTI. Doubt STI.  Reports he took pain medication prior to arrival and declines any pain medication at this time. He does have outpatient follow-up. Doubt there is any emergent, life-threatening condition present.  ED PROGRESS: Urine shows small hemoglobin but no other sign of infection. Again doubt that this is kidney stone or infection. X-ray showed degenerative changes with no acute abnormality. Discussed these findings with patient. Recommended close outpatient follow-up. He states Tylenol has helped his pain significantly the past. Discussed with him that he continues 650 to 1,000 mg every 6 hours pain. Have advised Britta Mccreedy to not take Percocet at the same time as they both have acetaminophen. Discussed with  him that he should not take more than 4000 mg of acetaminophen a day. Recommended alternating heat and ice as needed. Discussed return precautions. He verbalizes understanding and is comfortable with this plan.     Layla Maw Brennley Curtice, DO 05/11/15 [phone]

## 2015-07-23 ENCOUNTER — Telehealth: Payer: Self-pay | Admitting: Internal Medicine

## 2015-07-23 NOTE — Telephone Encounter (Signed)
Spoke to the patient. Advised him he would have to be seen and we would need the records of treatment. Advised him the 1st appointment I could see with Dr Alphonsus SiasLetvak would be May 17 at 8am. [facility]

## 2015-07-23 NOTE — Telephone Encounter (Signed)
I don't mind reviewing the MRI but without an exam, it is hard to tell exactly what is going on. He would probably need to request in writing for them to send it to me/ If he can't even walk, it sounds like he needs to be seen (like an ER--perhaps at the [facility] since his care has been there and the MRI results would be available)

## 2015-07-23 NOTE — Telephone Encounter (Signed)
Patient was last seen October,2014 by Dr.Letvak.  Patient has been going to the [facility].  Patient went to the [facility] for his leg.  Patient thinks it's a slipped disc in his back against a nerve.  Patient said he had an MRI done on 07/17/15 at the [facility].  Patient said he hasn't been notified about the results. Patient left a message with his provider 3 days ago to get the results.  Patient said he's tired of taking pain medications.  [facility].  Patient can't come in for an office visit with Dr.Letvak because he can't put any pressure on his leg. Please advise.

## 2015-07-28 ENCOUNTER — Emergency Department (HOSPITAL_COMMUNITY): Payer: Medicare Other

## 2015-07-28 ENCOUNTER — Observation Stay (HOSPITAL_COMMUNITY)
Admission: EM | Admit: 2015-07-28 | Discharge: 2015-07-30 | Disposition: A | Discharge status: Home / Self Care | Payer: Medicare Other | Attending: Internal Medicine | Admitting: Internal Medicine

## 2015-07-28 ENCOUNTER — Encounter (HOSPITAL_COMMUNITY): Payer: Self-pay | Admitting: *Deleted

## 2015-07-28 ENCOUNTER — Inpatient Hospital Stay (HOSPITAL_COMMUNITY): Payer: Medicare Other

## 2015-07-28 DIAGNOSIS — M6281 Muscle weakness (generalized): Secondary | ICD-10-CM | POA: Diagnosis not present

## 2015-07-28 DIAGNOSIS — M25562 Pain in left knee: Secondary | ICD-10-CM | POA: Diagnosis not present

## 2015-07-28 DIAGNOSIS — F17210 Nicotine dependence, cigarettes, uncomplicated: Secondary | ICD-10-CM | POA: Diagnosis not present

## 2015-07-28 DIAGNOSIS — Z794 Long term (current) use of insulin: Secondary | ICD-10-CM | POA: Diagnosis not present

## 2015-07-28 DIAGNOSIS — M4716 Other spondylosis with myelopathy, lumbar region: Secondary | ICD-10-CM | POA: Diagnosis present

## 2015-07-28 DIAGNOSIS — Z7951 Long term (current) use of inhaled steroids: Secondary | ICD-10-CM | POA: Diagnosis not present

## 2015-07-28 DIAGNOSIS — J449 Chronic obstructive pulmonary disease, unspecified: Secondary | ICD-10-CM | POA: Diagnosis not present

## 2015-07-28 DIAGNOSIS — K219 Gastro-esophageal reflux disease without esophagitis: Secondary | ICD-10-CM | POA: Insufficient documentation

## 2015-07-28 DIAGNOSIS — M4806 Spinal stenosis, lumbar region: Secondary | ICD-10-CM | POA: Insufficient documentation

## 2015-07-28 DIAGNOSIS — I10 Essential (primary) hypertension: Secondary | ICD-10-CM | POA: Diagnosis not present

## 2015-07-28 DIAGNOSIS — R262 Difficulty in walking, not elsewhere classified: Secondary | ICD-10-CM | POA: Diagnosis not present

## 2015-07-28 DIAGNOSIS — G9520 Unspecified cord compression: Secondary | ICD-10-CM | POA: Diagnosis not present

## 2015-07-28 DIAGNOSIS — Z01818 Encounter for other preprocedural examination: Secondary | ICD-10-CM | POA: Diagnosis not present

## 2015-07-28 DIAGNOSIS — G8929 Other chronic pain: Secondary | ICD-10-CM | POA: Insufficient documentation

## 2015-07-28 DIAGNOSIS — M545 Low back pain, unspecified: Secondary | ICD-10-CM

## 2015-07-28 DIAGNOSIS — K8020 Calculus of gallbladder without cholecystitis without obstruction: Secondary | ICD-10-CM | POA: Insufficient documentation

## 2015-07-28 DIAGNOSIS — Z79899 Other long term (current) drug therapy: Secondary | ICD-10-CM | POA: Diagnosis not present

## 2015-07-28 DIAGNOSIS — Z7982 Long term (current) use of aspirin: Secondary | ICD-10-CM | POA: Insufficient documentation

## 2015-07-28 DIAGNOSIS — E785 Hyperlipidemia, unspecified: Secondary | ICD-10-CM | POA: Insufficient documentation

## 2015-07-28 DIAGNOSIS — Z882 Allergy status to sulfonamides status: Secondary | ICD-10-CM | POA: Insufficient documentation

## 2015-07-28 DIAGNOSIS — E1140 Type 2 diabetes mellitus with diabetic neuropathy, unspecified: Secondary | ICD-10-CM | POA: Diagnosis not present

## 2015-07-28 DIAGNOSIS — R1032 Left lower quadrant pain: Secondary | ICD-10-CM | POA: Diagnosis not present

## 2015-07-28 DIAGNOSIS — M5127 Other intervertebral disc displacement, lumbosacral region: Secondary | ICD-10-CM | POA: Insufficient documentation

## 2015-07-28 DIAGNOSIS — I2510 Atherosclerotic heart disease of native coronary artery without angina pectoris: Secondary | ICD-10-CM | POA: Insufficient documentation

## 2015-07-28 DIAGNOSIS — Z419 Encounter for procedure for purposes other than remedying health state, unspecified: Secondary | ICD-10-CM

## 2015-07-28 DIAGNOSIS — I700 Atherosclerosis of aorta: Secondary | ICD-10-CM | POA: Diagnosis not present

## 2015-07-28 DIAGNOSIS — R531 Weakness: Secondary | ICD-10-CM | POA: Insufficient documentation

## 2015-07-28 DIAGNOSIS — J301 Allergic rhinitis due to pollen: Secondary | ICD-10-CM | POA: Insufficient documentation

## 2015-07-28 DIAGNOSIS — E1165 Type 2 diabetes mellitus with hyperglycemia: Secondary | ICD-10-CM | POA: Diagnosis not present

## 2015-07-28 DIAGNOSIS — R29898 Other symptoms and signs involving the musculoskeletal system: Secondary | ICD-10-CM

## 2015-07-28 DIAGNOSIS — M5116 Intervertebral disc disorders with radiculopathy, lumbar region: Secondary | ICD-10-CM | POA: Diagnosis not present

## 2015-07-28 DIAGNOSIS — M4800 Spinal stenosis, site unspecified: Secondary | ICD-10-CM | POA: Diagnosis present

## 2015-07-28 DIAGNOSIS — M25552 Pain in left hip: Secondary | ICD-10-CM | POA: Diagnosis not present

## 2015-07-28 LAB — CBC WITH DIFFERENTIAL/PLATELET
BASOS ABS: 0 10*3/uL (ref 0.0–0.1)
BASOS PCT: 0 %
EOS ABS: 0.1 10*3/uL (ref 0.0–0.7)
EOS PCT: 1 %
HCT: 44.4 % (ref 39.0–52.0)
Hemoglobin: 15.3 g/dL (ref 13.0–17.0)
LYMPHS ABS: 2.6 10*3/uL (ref 0.7–4.0)
Lymphocytes Relative: 28 %
MCH: 31.8 pg (ref 26.0–34.0)
MCHC: 34.5 g/dL (ref 30.0–36.0)
MCV: 92.3 fL (ref 78.0–100.0)
Monocytes Absolute: 0.7 10*3/uL (ref 0.1–1.0)
Monocytes Relative: 7 %
NEUTROS PCT: 64 %
Neutro Abs: 6.1 10*3/uL (ref 1.7–7.7)
PLATELETS: 214 10*3/uL (ref 150–400)
RBC: 4.81 MIL/uL (ref 4.22–5.81)
RDW: 13.2 % (ref 11.5–15.5)
WBC: 9.6 10*3/uL (ref 4.0–10.5)

## 2015-07-28 LAB — COMPREHENSIVE METABOLIC PANEL
ALK PHOS: 63 U/L (ref 38–126)
ALT: 38 U/L (ref 17–63)
AST: 31 U/L (ref 15–41)
Albumin: 3.8 g/dL (ref 3.5–5.0)
Anion gap: 12 (ref 5–15)
BILIRUBIN TOTAL: 1.4 mg/dL — AB (ref 0.3–1.2)
BUN: 11 mg/dL (ref 6–20)
CALCIUM: 10 mg/dL (ref 8.9–10.3)
CO2: 27 mmol/L (ref 22–32)
CREATININE: 1.42 mg/dL — AB (ref 0.61–1.24)
Chloride: 94 mmol/L — ABNORMAL LOW (ref 101–111)
GFR, EST AFRICAN AMERICAN: 57 mL/min — AB (ref >60)
GFR, EST NON AFRICAN AMERICAN: 49 mL/min — AB (ref >60)
Glucose, Bld: 221 mg/dL — ABNORMAL HIGH (ref 65–99)
Potassium: 4.5 mmol/L (ref 3.5–5.1)
Sodium: 133 mmol/L — ABNORMAL LOW (ref 135–145)
TOTAL PROTEIN: 7.8 g/dL (ref 6.5–8.1)

## 2015-07-28 LAB — URINALYSIS, ROUTINE W REFLEX MICROSCOPIC
Bilirubin Urine: NEGATIVE
Glucose, UA: 250 mg/dL — AB
Ketones, ur: NEGATIVE mg/dL
Leukocytes, UA: NEGATIVE
NITRITE: NEGATIVE
PROTEIN: NEGATIVE mg/dL
Specific Gravity, Urine: 1.012 (ref 1.005–1.030)
pH: 5.5 (ref 5.0–8.0)

## 2015-07-28 LAB — GLUCOSE, CAPILLARY
GLUCOSE-CAPILLARY: 146 mg/dL — AB (ref 65–99)
GLUCOSE-CAPILLARY: 248 mg/dL — AB (ref 65–99)

## 2015-07-28 LAB — URINE MICROSCOPIC-ADD ON: SQUAMOUS EPITHELIAL / LPF: NONE SEEN

## 2015-07-28 LAB — TYPE AND SCREEN
ABO/RH(D): O POS
ANTIBODY SCREEN: NEGATIVE

## 2015-07-28 LAB — ABO/RH: ABO/RH(D): O POS

## 2015-07-28 MED ORDER — HYDRALAZINE HCL 20 MG/ML IJ SOLN: 10.0000 mg | INTRAMUSCULAR | Status: DC | PRN

## 2015-07-28 MED ORDER — METOPROLOL TARTRATE 50 MG PO TABS
50.0000 mg | ORAL_TABLET | Freq: Two times a day (BID) | ORAL | Status: DC
  Administered 2015-07-28 – 2015-07-30 (×4): 50 mg via ORAL
  Filled 2015-07-28 (×4): qty 1

## 2015-07-28 MED ORDER — ACETAMINOPHEN 650 MG RE SUPP: 650.0000 mg | Freq: Four times a day (QID) | RECTAL | Status: DC | PRN

## 2015-07-28 MED ORDER — FAMOTIDINE 20 MG PO TABS
20.0000 mg | ORAL_TABLET | Freq: Two times a day (BID) | ORAL | Status: DC
  Administered 2015-07-28 – 2015-07-30 (×4): 20 mg via ORAL
  Filled 2015-07-28 (×4): qty 1

## 2015-07-28 MED ORDER — VITAMIN D-3 25 MCG (1000 UT) PO CAPS: 1.0000 | ORAL_CAPSULE | Freq: Every day | ORAL | Status: DC

## 2015-07-28 MED ORDER — LORAZEPAM 2 MG/ML IJ SOLN
0.5000 mg | Freq: Once | INTRAMUSCULAR | Status: AC
  Administered 2015-07-28: 0.5 mg via INTRAVENOUS
  Filled 2015-07-28: qty 1

## 2015-07-28 MED ORDER — ZOLPIDEM TARTRATE 5 MG PO TABS: 5.0000 mg | ORAL_TABLET | Freq: Every evening | ORAL | Status: DC | PRN

## 2015-07-28 MED ORDER — VITAMIN D 1000 UNITS PO TABS
1000.0000 [IU] | ORAL_TABLET | Freq: Every day | ORAL | Status: DC
  Administered 2015-07-29 – 2015-07-30 (×2): 1000 [IU] via ORAL
  Filled 2015-07-28 (×2): qty 1

## 2015-07-28 MED ORDER — MENTHOL 3 MG MT LOZG: 1.0000 | LOZENGE | OROMUCOSAL | Status: DC | PRN

## 2015-07-28 MED ORDER — FLEET ENEMA 7-19 GM/118ML RE ENEM: 1.0000 | ENEMA | Freq: Once | RECTAL | Status: DC | PRN

## 2015-07-28 MED ORDER — IOPAMIDOL (ISOVUE-300) INJECTION 61%
INTRAVENOUS | Status: AC
  Administered 2015-07-28: 100 mL
  Filled 2015-07-28: qty 100

## 2015-07-28 MED ORDER — SODIUM CHLORIDE 0.9% FLUSH: 3.0000 mL | INTRAVENOUS | Status: DC | PRN

## 2015-07-28 MED ORDER — BISACODYL 5 MG PO TBEC: 5.0000 mg | DELAYED_RELEASE_TABLET | Freq: Every day | ORAL | Status: DC | PRN

## 2015-07-28 MED ORDER — DOCUSATE SODIUM 100 MG PO CAPS
100.0000 mg | ORAL_CAPSULE | Freq: Two times a day (BID) | ORAL | Status: DC
  Administered 2015-07-28 – 2015-07-30 (×4): 100 mg via ORAL
  Filled 2015-07-28 (×4): qty 1

## 2015-07-28 MED ORDER — LORATADINE 10 MG PO TABS
10.0000 mg | ORAL_TABLET | Freq: Every day | ORAL | Status: DC
  Administered 2015-07-28 – 2015-07-30 (×3): 10 mg via ORAL
  Filled 2015-07-28 (×3): qty 1

## 2015-07-28 MED ORDER — ONDANSETRON HCL 4 MG/2ML IJ SOLN: 4.0000 mg | INTRAMUSCULAR | Status: DC | PRN

## 2015-07-28 MED ORDER — NICOTINE 21 MG/24HR TD PT24
21.0000 mg | MEDICATED_PATCH | Freq: Once | TRANSDERMAL | Status: DC
  Filled 2015-07-28: qty 1

## 2015-07-28 MED ORDER — METHOCARBAMOL 1000 MG/10ML IJ SOLN
500.0000 mg | Freq: Four times a day (QID) | INTRAVENOUS | Status: DC | PRN
  Filled 2015-07-28: qty 5

## 2015-07-28 MED ORDER — METHOCARBAMOL 500 MG PO TABS: 500.0000 mg | ORAL_TABLET | Freq: Four times a day (QID) | ORAL | Status: DC | PRN

## 2015-07-28 MED ORDER — CEFAZOLIN SODIUM-DEXTROSE 2-4 GM/100ML-% IV SOLN: 2.0000 g | INTRAVENOUS | Status: DC

## 2015-07-28 MED ORDER — HYDROMORPHONE HCL 1 MG/ML IJ SOLN: 0.5000 mg | INTRAMUSCULAR | Status: DC | PRN

## 2015-07-28 MED ORDER — ATORVASTATIN CALCIUM 10 MG PO TABS
10.0000 mg | ORAL_TABLET | Freq: Every day | ORAL | Status: DC
  Administered 2015-07-29: 10 mg via ORAL
  Filled 2015-07-28 (×2): qty 1

## 2015-07-28 MED ORDER — ALBUTEROL SULFATE (2.5 MG/3ML) 0.083% IN NEBU: 2.5000 mg | INHALATION_SOLUTION | RESPIRATORY_TRACT | Status: DC | PRN

## 2015-07-28 MED ORDER — ALBUTEROL SULFATE HFA 108 (90 BASE) MCG/ACT IN AERS: 2.0000 | INHALATION_SPRAY | Freq: Four times a day (QID) | RESPIRATORY_TRACT | Status: DC | PRN

## 2015-07-28 MED ORDER — PANTOPRAZOLE SODIUM 40 MG IV SOLR
40.0000 mg | Freq: Every day | INTRAVENOUS | Status: DC
  Administered 2015-07-28 – 2015-07-29 (×2): 40 mg via INTRAVENOUS
  Filled 2015-07-28 (×2): qty 40

## 2015-07-28 MED ORDER — SENNA 8.6 MG PO TABS
1.0000 | ORAL_TABLET | Freq: Two times a day (BID) | ORAL | Status: DC
Start: 2015-07-28 — End: 2015-07-30
  Administered 2015-07-28 – 2015-07-30 (×4): 8.6 mg via ORAL
  Filled 2015-07-28 (×4): qty 1

## 2015-07-28 MED ORDER — INSULIN ASPART 100 UNIT/ML ~~LOC~~ SOLN
0.0000 [IU] | SUBCUTANEOUS | Status: DC
Start: 2015-07-28 — End: 2015-07-30
  Administered 2015-07-28: 1 [IU] via SUBCUTANEOUS
  Administered 2015-07-29 (×3): 2 [IU] via SUBCUTANEOUS
  Administered 2015-07-29: 3 [IU] via SUBCUTANEOUS
  Administered 2015-07-29 – 2015-07-30 (×2): 2 [IU] via SUBCUTANEOUS
  Administered 2015-07-30 (×2): 3 [IU] via SUBCUTANEOUS

## 2015-07-28 MED ORDER — SODIUM CHLORIDE 0.9 % IV BOLUS (SEPSIS)
1000.0000 mL | Freq: Once | INTRAVENOUS | Status: AC
  Administered 2015-07-28: 1000 mL via INTRAVENOUS

## 2015-07-28 MED ORDER — ONDANSETRON HCL 4 MG/2ML IJ SOLN
4.0000 mg | Freq: Once | INTRAMUSCULAR | Status: AC
  Administered 2015-07-28: 4 mg via INTRAVENOUS
  Filled 2015-07-28: qty 2

## 2015-07-28 MED ORDER — OXYCODONE-ACETAMINOPHEN 5-325 MG PO TABS: 1.0000 | ORAL_TABLET | ORAL | Status: DC | PRN

## 2015-07-28 MED ORDER — HYDROCODONE-ACETAMINOPHEN 5-325 MG PO TABS: 1.0000 | ORAL_TABLET | ORAL | Status: DC | PRN

## 2015-07-28 MED ORDER — MORPHINE SULFATE (PF) 4 MG/ML IV SOLN
4.0000 mg | Freq: Once | INTRAVENOUS | Status: AC
  Administered 2015-07-28: 4 mg via INTRAVENOUS
  Filled 2015-07-28: qty 1

## 2015-07-28 MED ORDER — FLUTICASONE PROPIONATE 50 MCG/ACT NA SUSP
2.0000 | Freq: Every day | NASAL | Status: DC | PRN
  Filled 2015-07-28: qty 16

## 2015-07-28 MED ORDER — ONDANSETRON HCL 4 MG PO TABS: 4.0000 mg | ORAL_TABLET | Freq: Four times a day (QID) | ORAL | Status: DC | PRN

## 2015-07-28 MED ORDER — SODIUM CHLORIDE 0.9% FLUSH
3.0000 mL | Freq: Two times a day (BID) | INTRAVENOUS | Status: DC
  Administered 2015-07-28 – 2015-07-30 (×4): 3 mL via INTRAVENOUS

## 2015-07-28 MED ORDER — NAPROXEN 250 MG PO TABS: 500.0000 mg | ORAL_TABLET | Freq: Two times a day (BID) | ORAL | Status: DC

## 2015-07-28 MED ORDER — ACETAMINOPHEN 325 MG PO TABS: 650.0000 mg | ORAL_TABLET | Freq: Four times a day (QID) | ORAL | Status: DC | PRN

## 2015-07-28 MED ORDER — PHENOL 1.4 % MT LIQD: 1.0000 | OROMUCOSAL | Status: DC | PRN

## 2015-07-28 MED ORDER — ONDANSETRON HCL 4 MG/2ML IJ SOLN: 4.0000 mg | Freq: Four times a day (QID) | INTRAMUSCULAR | Status: DC | PRN

## 2015-07-28 NOTE — ED Provider Notes (Signed)
5:18 PM BP 125/70 mmHg  Pulse 90  Temp(Src) 97.5 F (36.4 C) (Oral)  Resp 18  SpO2 98%'  Patient taken in sign out from [place]. Patient has a history of chronic back pain, CAD, COPD, diabetes. He has had significantly worsening back pain. Her last 3 days. [facility], which was negative. Complains of shooting pain in the left abdomen that wraps around the front of the groin. Pain was treated here, but he continues to have weakness in the left leg. He has been using a wheelchair at home. His scans are negative. However, due to weakness. He is getting an MRI of the back.  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 Although pain is better controlled and the patient is able to ambulate, he has some weakness. Dr. Bevely Palmeritty asks for admit the patient for surgical intervention. I have spoken with dr. Lars Massonkarakandy who will admit the patient. Images reviewed with Dr. Silverio LayYAO who agrees.  Arthor Captainbigail Quinnie Barcelo, PA-C 08/02/15 [id number]  Richardean Canalavid H Yao, MD 08/02/15 [phone]

## 2015-07-28 NOTE — ED Notes (Signed)
Ambulated Pt with Assistance in the hallway. Pt stated his left lower leg felt weak a little while walking. Pt was a little unsteady but with assistance was fine. Pt hooked back up to Monitor and in bed.

## 2015-07-28 NOTE — H&P (Signed)
History and Physical    Howard Dixon WUJ:[id number] DOB: 07-03-1945 DOA: 07/28/2015  Referring MD/NP/PA: Ms.[email or web address]. PCP: [facility]  Outpatient Specialists: None. Patient coming from: Home.  Chief Complaint: Low back pain.  HPI: Howard Dixon is a 70 y.o. male with medical history significant of diabetes mellitus type 2, chronic kidney disease, hypertension, hyperlipidemia and CAD presents to the ER because of worsening low back pain radiating to his left leg. Patient states his low back pain started around January of this year which has gradually worsened over the last few months. Denies any trauma or fall. Over the last 1 week patient is finding it increasingly difficult to walk. Denies any incontinence of urine or bowels. MRI of the lumbosacral spine shows disc extrusion at L2-L3 level with spinal stenosis. On-call neurosurgeon Dr.Ditty has been consulted and patient is scheduled for surgery in the morning.  ED Course: Started on pain relief medications. Patient had MRI and CT of the abdomen. X-rays of the left knee and hip.  Review of Systems: As per HPI otherwise 10 point review of systems negative.    Past Medical History  Diagnosis Date  . Allergic rhinitis due to pollen   . Coronary artery disease, occlusive   . Hyperlipidemia   . Hypertension   . Type II or unspecified type diabetes mellitus with neurological manifestations, not stated as uncontrolled   . GERD (gastroesophageal reflux disease)   . COPD (chronic obstructive pulmonary disease) [facility])     Past Surgical History  Procedure Laterality Date  . Hernia repair    . Back surgery       reports that he has been smoking Cigarettes.  He started smoking about 53 years ago. He has been smoking about 1.00 pack per day. He has never used smokeless tobacco. He reports that he does not drink alcohol or use illicit drugs.  Allergies  Allergen Reactions  . Sulfa Antibiotics Rash    Family History    Problem Relation Age of Onset  . Diabetes Mother   . Hypertension Mother   . Diabetes Father   . Heart disease Father   . Hypertension Father   . Diabetes Sister   . Hypertension Sister   . Diabetes Brother   . Heart disease Brother   . Hypertension Brother   . Cancer Neg Hx   . Diabetes Brother   . Hypertension Brother   . Diabetes Brother   . Heart disease Brother   . Kidney disease Brother   . Hypertension Brother     Prior to Admission medications   Medication Sig Start Date End Date Taking? Authorizing Provider  albuterol (PROVENTIL HFA;VENTOLIN HFA) 108 (90 BASE) MCG/ACT inhaler Inhale 2 puffs into the lungs 4 (four) times daily as needed.   Yes Historical Provider, MD  aspirin 81 MG tablet Take 81 mg by mouth daily.   Yes Historical Provider, MD  atorvastatin (LIPITOR) 10 MG tablet Take 10 mg by mouth daily at 6 PM. NOT SURE OF DOSE   Yes Historical Provider, MD  BD ULTRA-FINE LANCETS lancets 1 each by Other route. Use as instructed   Yes Historical Provider, MD  Cholecalciferol (VITAMIN D-3) 1000 UNITS CAPS Take 1 capsule by mouth daily.   Yes Historical Provider, MD  flunisolide (NASAREL) 29 MCG/ACT (0.025%) nasal spray Place 2 sprays into the nose 2 (two) times daily. Dose is for each nostril.   Yes Historical Provider, MD  glipiZIDE (GLUCOTROL) 10 MG tablet Take 10  mg by mouth 2 (two) times daily before a meal.   Yes Historical Provider, MD  glucose blood (PRECISION XTRA TEST STRIPS) test strip 1 each by Other route as needed. Use as instructed   Yes Historical Provider, MD  hydrocerin (EUCERIN) CREA Apply 1 application topically 2 (two) times daily.   Yes Historical Provider, MD  hydrochlorothiazide (HYDRODIURIL) 25 MG tablet Take 12.5 mg by mouth daily.   Yes Historical Provider, MD  insulin glargine (LANTUS) 100 UNIT/ML injection Inject 20 Units into the skin at bedtime.   Yes Historical Provider, MD  Insulin Syringe-Needle U-100 (INSULIN SYRINGE .5CC/31GX5/16") 31G X  5/16" 0.5 ML MISC by Does not apply route.   Yes Historical Provider, MD  lisinopril (PRINIVIL,ZESTRIL) 40 MG tablet Take 20 mg by mouth daily.   Yes Historical Provider, MD  loratadine (CLARITIN) 10 MG tablet Take 10 mg by mouth daily.   Yes Historical Provider, MD  metoprolol (LOPRESSOR) 50 MG tablet Take 50 mg by mouth 2 (two) times daily.    Yes Historical Provider, MD  naproxen (NAPROSYN) 500 MG tablet Take 500 mg by mouth 2 (two) times daily with a meal.   Yes Historical Provider, MD  ranitidine (ZANTAC) 150 MG capsule Take 150 mg by mouth 2 (two) times daily.   Yes Historical Provider, MD  metFORMIN (GLUCOPHAGE) 1000 MG tablet Take 1,000 mg by mouth 2 (two) times daily with a meal.    Historical Provider, MD    Physical Exam: Filed Vitals:   07/28/15 1804 07/28/15 1815 07/28/15 2004 07/28/15 2015  BP: 140/90 143/90 118/65 111/79  Pulse: 91 117 91 95  Temp: 97.6 F (36.4 C)     TempSrc: Oral     Resp: 22  20 21   SpO2: 99% 99% 95% 95%      Constitutional: Not in distress. Filed Vitals:   07/28/15 1804 07/28/15 1815 07/28/15 2004 07/28/15 2015  BP: 140/90 143/90 118/65 111/79  Pulse: 91 117 91 95  Temp: 97.6 F (36.4 C)     TempSrc: Oral     Resp: 22  20 21   SpO2: 99% 99% 95% 95%   Eyes: Anicteric no pallor. ENMT: No discharge from the ears eyes nose or mouth. Neck: No mass felt. No JVD appreciated. Respiratory: No rhonchi or crepitations. Cardiovascular: S1 and S2 heard. Abdomen: Soft nontender bowel sounds present. Musculoskeletal: Pain on moving left leg. Skin: No rash. Neurologic: Alert awake oriented to time place and person. Moves all extremities. Psychiatric: Appears normal.   Labs on Admission: I have personally reviewed following labs and imaging studies  CBC:  Recent Labs Lab 07/28/15 1114  WBC 9.6  NEUTROABS 6.1  HGB 15.3  HCT 44.4  MCV 92.3  PLT 214   Basic Metabolic Panel:  Recent Labs Lab 07/28/15 1114  NA 133*  K 4.5  CL 94*  CO2  27  GLUCOSE 221*  BUN 11  CREATININE 1.42*  CALCIUM 10.0   GFR: CrCl cannot be calculated (Unknown ideal weight.). Liver Function Tests:  Recent Labs Lab 07/28/15 1114  AST 31  ALT 38  ALKPHOS 63  BILITOT 1.4*  PROT 7.8  ALBUMIN 3.8   No results for input(s): LIPASE, AMYLASE in the last 168 hours. No results for input(s): AMMONIA in the last 168 hours. Coagulation Profile: No results for input(s): INR, PROTIME in the last 168 hours. Cardiac Enzymes: No results for input(s): CKTOTAL, CKMB, CKMBINDEX, TROPONINI in the last 168 hours. BNP (last 3 results) No results for  input(s): PROBNP in the last 8760 hours. HbA1C: No results for input(s): HGBA1C in the last 72 hours. CBG: No results for input(s): GLUCAP in the last 168 hours. Lipid Profile: No results for input(s): CHOL, HDL, LDLCALC, TRIG, CHOLHDL, LDLDIRECT in the last 72 hours. Thyroid Function Tests: No results for input(s): TSH, T4TOTAL, FREET4, T3FREE, THYROIDAB in the last 72 hours. Anemia Panel: No results for input(s): VITAMINB12, FOLATE, FERRITIN, TIBC, IRON, RETICCTPCT in the last 72 hours. Urine analysis:    Component Value Date/Time   COLORURINE YELLOW 07/28/2015 1020   APPEARANCEUR CLEAR 07/28/2015 1020   LABSPEC 1.012 07/28/2015 1020   PHURINE 5.5 07/28/2015 1020   GLUCOSEU 250* 07/28/2015 1020   HGBUR SMALL* 07/28/2015 1020   BILIRUBINUR NEGATIVE 07/28/2015 1020   KETONESUR NEGATIVE 07/28/2015 1020   PROTEINUR NEGATIVE 07/28/2015 1020   NITRITE NEGATIVE 07/28/2015 1020   LEUKOCYTESUR NEGATIVE 07/28/2015 1020   Sepsis Labs: (procalcitonin:4,lacticidven:4) )No results found for this or any previous visit (from the past 240 hour(s)).   Radiological Exams on Admission: Dg Chest 2 View  07/28/2015  CLINICAL DATA:  Pre-op for lumbar micro discectomy. EXAM: CHEST  2 VIEW COMPARISON:  Limited correlation made with abdominal CT 07/28/2015. FINDINGS: The heart size and mediastinal contours are  normal. The lungs are clear. There is mild central airway thickening. There is no pleural effusion or pneumothorax. No acute osseous findings are identified. IMPRESSION: Mild central airway thickening attributed to smoking. No acute cardiopulmonary process. Electronically Signed   By: Carey Bullocks M.D.   On: 07/28/2015 19:54   Mr Lumbar Spine Wo Contrast  07/28/2015  CLINICAL DATA:  Low back and groin pain. Decreased ability to ambulate. EXAM: MRI LUMBAR SPINE WITHOUT CONTRAST TECHNIQUE: Multiplanar, multisequence MR imaging of the lumbar spine was performed. No intravenous contrast was administered. COMPARISON:  CT abdomen and pelvis 07/28/2015 FINDINGS: There are bilateral L5 pars defects with 11 mm anterolisthesis of L5 on S1. There is severe disc space height loss at L5-S1 with osseous fusion across the disc space. Slight retrolisthesis is noted of L2 on L3 and L3 on L4. Severe disc space narrowing is present at L2-3 with prominent degenerative endplate marrow changes including moderate edema. There is mild disc space narrowing at L3-4. Vertebral body heights are preserved. The conus medullaris is normal in signal and terminates at T12. There is mild nonspecific paraspinal muscular edema bilaterally in the lower lumbar spine. L1-2:  Negative. L2-3: Circumferential disc bulging with superimposed, large left central disc extrusion which demonstrates caudal migration in the left lateral recess to the mid L3 vertebral body level. Along with mild facet and ligamentum flavum hypertrophy, the disc bulging and extrusion result in severe spinal stenosis, left greater than right lateral recess stenosis, and mild left neural foraminal narrowing. L3-4: Mild disc bulging, small superimposed right paracentral/subarticular disc extrusion with minimal caudal migration, and mild facet and ligamentum flavum hypertrophy result in moderate spinal stenosis, moderate right and mild left lateral recess stenosis, and mild right  neural foraminal stenosis. L4-5: Minimal leftward disc bulging and mild-to-moderate facet hypertrophy result in minimal left neural foraminal narrowing. No significant spinal stenosis. L5-S1: Listhesis with disc uncovering and disc space height loss result in severe left greater than right neural foraminal stenosis without significant spinal stenosis. IMPRESSION: 1. Large L2-3 disc extrusion with severe spinal stenosis. 2. Moderate spinal stenosis and right greater than left lateral recess stenosis at L3-4. 3. Grade 2 anterolisthesis of L5 on S1 with severe bilateral foraminal stenosis. Electronically Signed  By: Sebastian Ache M.D.   On: 07/28/2015 17:35   Ct Abdomen Pelvis W Contrast  07/28/2015  CLINICAL DATA:  Left lower quadrant and low back pain, chronic. No known injury. Initial encounter. EXAM: CT ABDOMEN AND PELVIS WITH CONTRAST TECHNIQUE: Multidetector CT imaging of the abdomen and pelvis was performed using the standard protocol following bolus administration of intravenous contrast. CONTRAST:  100 ISOVUE-300 IOPAMIDOL (ISOVUE-300) INJECTION 61% COMPARISON:  None. FINDINGS: The lung bases are clear. No pleural or pericardial effusion. Calcific coronary artery disease is identified. A single tiny stone is seen in the neck of the gallbladder. There is no gallbladder wall thickening or surrounding inflammatory change. The liver, adrenal glands, pancreas and spleen appear normal. Tiny hypoattenuating lesion the mid pole of the right kidney is likely a cyst. Mild scarring is seen in the mid and lower pole of the left kidney. Extensive aortoiliac atherosclerosis without aneurysm is identified. The stomach, small and large bowel and appendix appear normal. No lymphadenopathy or fluid is seen. The L5-S1 level is fused with 1 cm anterolisthesis identified. There is marked degenerative disc disease at L3-4 where there is loss of disc space height and endplate spurring. A cystic appearing structure off the medial  and inferior aspect of the left facet joint measures 1.2 cm craniocaudal by 1.2 cm transverse by 1.1 cm AP and has partial rim calcification. It causes marked central canal and left lateral recess narrowing. IMPRESSION: No acute abnormality abdomen or pelvis. Lumbar spondylosis appearing worst at L3-4 there is a large synovial cyst or calcified disc fragment just below the disc interspace in the left lateral recess causing marked central canal stenosis and left lateral recess narrowing. If the patient has radicular symptoms, MRI of the lumbar spine could be used further evaluation. Extensive calcific coronary and aortic atherosclerosis. Single small gallstone without evidence of cholecystitis. Electronically Signed   By: Drusilla Kanner M.D.   On: 07/28/2015 13:42   Dg Knee Complete 4 Views Left  07/28/2015  CLINICAL DATA:  Chronic left hip pain EXAM: LEFT KNEE - COMPLETE 4+ VIEW COMPARISON:  None. FINDINGS: The left knee is located. No acute bone or soft tissue abnormality is present. Diffuse atherosclerotic calcifications are present. IMPRESSION: 1. No acute abnormality of the left knee. 2. Atherosclerosis. Electronically Signed   By: Marin Roberts M.D.   On: 07/28/2015 13:41   Dg Hip Unilat With Pelvis 2-3 Views Left  07/28/2015  CLINICAL DATA:  Chronic left hip and knee pain over the last 5 months. EXAM: DG HIP (WITH OR WITHOUT PELVIS) 2-3V LEFT COMPARISON:  Left hip radiographs 05/11/2015. FINDINGS: Mild degenerative changes are present in the lower lumbar spine. Atherosclerotic calcifications are seen bilaterally. Spells is intact. The left hip is located. No acute bone or soft tissue abnormality is present. IMPRESSION: 1. No acute abnormality or significant interval change. 2. Atherosclerosis. Electronically Signed   By: Marin Roberts M.D.   On: 07/28/2015 13:40    EKG: Independently reviewed. Normal sinus rhythm with PVCs and old inferior infarct.  Assessment/Plan Principal  Problem:   Lumbar disc herniation with radiculopathy Active Problems:   Hypertension   COPD (chronic obstructive pulmonary disease) (HCC)   Spinal stenosis   Type 2 diabetes mellitus with hyperglycemia (HCC)   Low back pain    #1. Disc extrusion at L2-L3 level with spinal stenosis and low back pain - neurosurgery Dr. Bevely Palmer has been counseled and patient is scheduled for surgery in a.m. Patient is at moderate risk for  intermediate risk procedure. We'll keep patient nothing by mouth after midnight. #2. Diabetes mellitus type 2 with hyperglycemia - patient states his Lantus insulin and metformin was recently discontinued 3 weeks ago by his primary care physician since his hemoglobin A1c was around 5.7. At this time patient is only on Glucotrol which will be continued after surgery. For now I have placed patient on sliding scale coverage. #3. Hypertension - we'll continue metoprolol. Hold HCTZ until surgery and restart after surgery. #4. Hyperlipidemia on statins. #5. Chronic kidney disease stage III - follow metabolic panel. #6. History of CAD - denies having any chest pain. Has not had any previous history of CABG or stents placed. Restart aspirin after surgery as per neurosurgery. #7. COPD Presley not wheezing.   DVT prophylaxis: SCDs. Code Status: Full code.  Family Communication: Patient's wife.  Disposition Plan: Possibly rehabilitation.  Consults called: Neurosurgery.  Admission status: Inpatient. MedSurg. Likely stay 3-4 days.    Eduard ClosKAKRAKANDY,Haston Casebolt N. MD [facility] Pager [phone].  If 7PM-7AM, please contact night-coverage [email or web address] Password [email or web address]  07/28/2015, 8:41 PM

## 2015-07-28 NOTE — Progress Notes (Signed)
Patient arrived around 2100 alert and oriented no complaints of pain left leg is not symptomatic at this time. Will continue to monitor.

## 2015-07-28 NOTE — ED Notes (Signed)
Pt reports having hemorrhoids and unable to have bowel movement for several days. Also reports chronic pain to back and groin area. No acute distress noted at triage.

## 2015-07-28 NOTE — ED Notes (Signed)
Patient transported to X-ray 

## 2015-07-28 NOTE — ED Provider Notes (Signed)
CSN: [id number]     Arrival date & time 07/28/15  4098 History   First MD Initiated Contact with Patient 07/28/15 1028     Chief Complaint  Patient presents with  . Constipation     (Consider location/radiation/quality/duration/timing/severity/associated sxs/prior Treatment) HPI   [name] is a 70 y.o. male, with a history of CAD, COPD, DM, and HTN, presenting to the ED with left lower quadrant abdominal pain as well as left lower back and groin pain intermittent since January 2, significantly worse in the last 3 days. Rates his pain at 10/10, shooting in nature, radiating down the left leg. Pt states that over the last week, his pain has continually worsened and he endorses some weakness in the left leg. As a result, pt states he has not been ambulating almost at all for the past week. Takes tylenol every 6 hours with some relief. Also complains of constipation. Last BM was 1 weeks ago. Pt states he can not have a BM because his hemorrhoids have swollen so much he physically can't push the stool through them. Denies fever/chills, night sweats, scrotal/penile swelling, hematochezia/melena, urinary complaints, or any other complaints.   Past Medical History  Diagnosis Date  . Allergic rhinitis due to pollen   . Coronary artery disease, occlusive   . Hyperlipidemia   . Hypertension   . Type II or unspecified type diabetes mellitus with neurological manifestations, not stated as uncontrolled   . GERD (gastroesophageal reflux disease)   . COPD (chronic obstructive pulmonary disease) [facility])    Past Surgical History  Procedure Laterality Date  . Hernia repair    . Back surgery     Family History  Problem Relation Age of Onset  . Diabetes Mother   . Hypertension Mother   . Diabetes Father   . Heart disease Father   . Hypertension Father   . Diabetes Sister   . Hypertension Sister   . Diabetes Brother   . Heart disease Brother   . Hypertension Brother   . Cancer Neg Hx   .  Diabetes Brother   . Hypertension Brother   . Diabetes Brother   . Heart disease Brother   . Kidney disease Brother   . Hypertension Brother    Social History  Substance Use Topics  . Smoking status: Current Every Day Smoker -- 1.00 packs/day    Types: Cigarettes    Start date: 03/22/1962  . Smokeless tobacco: Never Used  . Alcohol Use: No    Review of Systems  Constitutional: Negative for fever, chills and diaphoresis.  Respiratory: Negative for shortness of breath.   Cardiovascular: Negative for chest pain.  Gastrointestinal: Positive for abdominal pain and constipation. Negative for nausea, vomiting and diarrhea.  Genitourinary: Negative for dysuria, hematuria and flank pain.  Musculoskeletal: Positive for back pain and arthralgias.  Skin: Negative for color change, pallor, rash and wound.  Neurological: Positive for weakness. Negative for dizziness, light-headedness, numbness and headaches.  All other systems reviewed and are negative.     Allergies  Sulfa antibiotics  Home Medications   Prior to Admission medications   Medication Sig Start Date End Date Taking? Authorizing Provider  albuterol (PROVENTIL HFA;VENTOLIN HFA) 108 (90 BASE) MCG/ACT inhaler Inhale 2 puffs into the lungs 4 (four) times daily as needed.   Yes Historical Provider, MD  aspirin 81 MG tablet Take 81 mg by mouth daily.   Yes Historical Provider, MD  atorvastatin (LIPITOR) 10 MG tablet Take 10 mg by mouth  daily at 6 PM. NOT SURE OF DOSE   Yes Historical Provider, MD  BD ULTRA-FINE LANCETS lancets 1 each by Other route. Use as instructed   Yes Historical Provider, MD  Cholecalciferol (VITAMIN D-3) 1000 UNITS CAPS Take 1 capsule by mouth daily.   Yes Historical Provider, MD  flunisolide (NASAREL) 29 MCG/ACT (0.025%) nasal spray Place 2 sprays into the nose 2 (two) times daily. Dose is for each nostril.   Yes Historical Provider, MD  glipiZIDE (GLUCOTROL) 10 MG tablet Take 10 mg by mouth 2 (two) times  daily before a meal.   Yes Historical Provider, MD  glucose blood (PRECISION XTRA TEST STRIPS) test strip 1 each by Other route as needed. Use as instructed   Yes Historical Provider, MD  hydrocerin (EUCERIN) CREA Apply 1 application topically 2 (two) times daily.   Yes Historical Provider, MD  hydrochlorothiazide (HYDRODIURIL) 25 MG tablet Take 12.5 mg by mouth daily.   Yes Historical Provider, MD  insulin glargine (LANTUS) 100 UNIT/ML injection Inject 20 Units into the skin at bedtime.   Yes Historical Provider, MD  Insulin Syringe-Needle U-100 (INSULIN SYRINGE .5CC/31GX5/16") 31G X 5/16" 0.5 ML MISC by Does not apply route.   Yes Historical Provider, MD  lisinopril (PRINIVIL,ZESTRIL) 40 MG tablet Take 20 mg by mouth daily.   Yes Historical Provider, MD  loratadine (CLARITIN) 10 MG tablet Take 10 mg by mouth daily.   Yes Historical Provider, MD  metoprolol (LOPRESSOR) 50 MG tablet Take 50 mg by mouth 2 (two) times daily.    Yes Historical Provider, MD  naproxen (NAPROSYN) 500 MG tablet Take 500 mg by mouth 2 (two) times daily with a meal.   Yes Historical Provider, MD  ranitidine (ZANTAC) 150 MG capsule Take 150 mg by mouth 2 (two) times daily.   Yes Historical Provider, MD  metFORMIN (GLUCOPHAGE) 1000 MG tablet Take 1,000 mg by mouth 2 (two) times daily with a meal.    Historical Provider, MD   BP 125/70 mmHg  Pulse 90  Temp(Src) 97.5 F (36.4 C) (Oral)  Resp 18  SpO2 98% Physical Exam  Constitutional: He is oriented to person, place, and time. He appears well-developed and well-nourished. No distress.  HENT:  Head: Normocephalic and atraumatic.  Eyes: Conjunctivae are normal. Pupils are equal, round, and reactive to light.  Neck: Neck supple.  Cardiovascular: Normal rate, regular rhythm, normal heart sounds and intact distal pulses.   Pulmonary/Chest: Effort normal and breath sounds normal. No respiratory distress.  Abdominal: Soft. Bowel sounds are normal. There is tenderness in the  left lower quadrant. There is no guarding. Hernia confirmed negative in the right inguinal area and confirmed negative in the left inguinal area.  Some minor tenderness noted to left lower quadrant verbalized by the patient.  Genitourinary: Rectum normal and prostate normal. Cremasteric reflex is present. Right testis shows no swelling and no tenderness. Left testis shows no swelling and no tenderness. Circumcised. No penile tenderness. No discharge found.  No discernible hernia. No penile or scrotal swelling or tenderness. No penile discharge. No skin lesions. Overall normal male genitalia.  Rectal exam: Stool burden in the rectal vault. No global gross blood. Prostate is not enlarged or tender. No internal or external hemorrhoids appreciated. RN served as Biomedical engineer during both exams.  Musculoskeletal: He exhibits no edema or tenderness.  No abnormalities noted to the patient's left leg.  Lymphadenopathy:    He has no cervical adenopathy.       Right: No inguinal  adenopathy present.       Left: No inguinal adenopathy present.  Neurological: He is alert and oriented to person, place, and time. He has normal reflexes.  No sensory deficits. Strength 4/5 in left leg. Strength 5/5 in both upper and right lower extremities. Pt is unable to effectively walk as he states if he puts weight on the left side, it won't support him. Coordination intact.   Skin: Skin is warm and dry. He is not diaphoretic.  Psychiatric: He has a normal mood and affect. His behavior is normal.  Nursing note and vitals reviewed.   ED Course  Procedures (including critical care time) Labs Review Labs Reviewed  COMPREHENSIVE METABOLIC PANEL - Abnormal; Notable for the following:    Sodium 133 (*)    Chloride 94 (*)    Glucose, Bld 221 (*)    Creatinine, Ser 1.42 (*)    Total Bilirubin 1.4 (*)    GFR calc non Af Amer 49 (*)    GFR calc Af Amer 57 (*)    All other components within normal limits  URINALYSIS, ROUTINE W  REFLEX MICROSCOPIC (NOT AT [facility]) - Abnormal; Notable for the following:    Glucose, UA 250 (*)    Hgb urine dipstick SMALL (*)    All other components within normal limits  URINE MICROSCOPIC-ADD ON - Abnormal; Notable for the following:    Bacteria, UA RARE (*)    Casts HYALINE CASTS (*)    All other components within normal limits  CBC WITH DIFFERENTIAL/PLATELET    Imaging Review Ct Abdomen Pelvis W Contrast  07/28/2015  CLINICAL DATA:  Left lower quadrant and low back pain, chronic. No known injury. Initial encounter. EXAM: CT ABDOMEN AND PELVIS WITH CONTRAST TECHNIQUE: Multidetector CT imaging of the abdomen and pelvis was performed using the standard protocol following bolus administration of intravenous contrast. CONTRAST:  100 ISOVUE-300 IOPAMIDOL (ISOVUE-300) INJECTION 61% COMPARISON:  None. FINDINGS: The lung bases are clear. No pleural or pericardial effusion. Calcific coronary artery disease is identified. A single tiny stone is seen in the neck of the gallbladder. There is no gallbladder wall thickening or surrounding inflammatory change. The liver, adrenal glands, pancreas and spleen appear normal. Tiny hypoattenuating lesion the mid pole of the right kidney is likely a cyst. Mild scarring is seen in the mid and lower pole of the left kidney. Extensive aortoiliac atherosclerosis without aneurysm is identified. The stomach, small and large bowel and appendix appear normal. No lymphadenopathy or fluid is seen. The L5-S1 level is fused with 1 cm anterolisthesis identified. There is marked degenerative disc disease at L3-4 where there is loss of disc space height and endplate spurring. A cystic appearing structure off the medial and inferior aspect of the left facet joint measures 1.2 cm craniocaudal by 1.2 cm transverse by 1.1 cm AP and has partial rim calcification. It causes marked central canal and left lateral recess narrowing. IMPRESSION: No acute abnormality abdomen or pelvis. Lumbar  spondylosis appearing worst at L3-4 there is a large synovial cyst or calcified disc fragment just below the disc interspace in the left lateral recess causing marked central canal stenosis and left lateral recess narrowing. If the patient has radicular symptoms, MRI of the lumbar spine could be used further evaluation. Extensive calcific coronary and aortic atherosclerosis. Single small gallstone without evidence of cholecystitis. Electronically Signed   By: Drusilla Kanner M.D.   On: 07/28/2015 13:42   Dg Knee Complete 4 Views Left  07/28/2015  CLINICAL  DATA:  Chronic left hip pain EXAM: LEFT KNEE - COMPLETE 4+ VIEW COMPARISON:  None. FINDINGS: The left knee is located. No acute bone or soft tissue abnormality is present. Diffuse atherosclerotic calcifications are present. IMPRESSION: 1. No acute abnormality of the left knee. 2. Atherosclerosis. Electronically Signed   By: Marin Robertshristopher  Mattern M.D.   On: 07/28/2015 13:41   Dg Hip Unilat With Pelvis 2-3 Views Left  07/28/2015  CLINICAL DATA:  Chronic left hip and knee pain over the last 5 months. EXAM: DG HIP (WITH OR WITHOUT PELVIS) 2-3V LEFT COMPARISON:  Left hip radiographs 05/11/2015. FINDINGS: Mild degenerative changes are present in the lower lumbar spine. Atherosclerotic calcifications are seen bilaterally. Spells is intact. The left hip is located. No acute bone or soft tissue abnormality is present. IMPRESSION: 1. No acute abnormality or significant interval change. 2. Atherosclerosis. Electronically Signed   By: Marin Robertshristopher  Mattern M.D.   On: 07/28/2015 13:40   I have personally reviewed and evaluated these images and lab results as part of my medical decision-making.   EKG Interpretation None      MDM   Final diagnoses:  Lower back pain    Howard RidgeJohn W Ellwood presents with left lower back, left lower quadrant, and groin pain since January.  Findings and plan of care discussed with Alvira MondayErin Schlossman, MD.   Patient's presentation gives some  concern for either an intra-abdominal process or radiculopathy. Patient is nontoxic appearing, afebrile, not tachycardic, not tachypneic, maintains SPO2 of 97-98% on room air, and is in no apparent distress. Patient has no signs of sepsis or other serious or life-threatening condition. Originally, it seemed as though the patient's pain may be coming from his abdomen. However, based on the CT results, it seems the patient is actually having radicular symptoms extending from the lower back. Patient was also able to be better examined following analgesia administration. MRI of the lumbar spine ordered.  5:16 PM End of shift patient care report given to Arthor CaptainAbigail Harris, PA-C. Plan: Review MRI results and respond accordingly. If no acute changes, discharge home with pain management.  Filed Vitals:   07/28/15 0911 07/28/15 1000 07/28/15 1015  BP: 113/69 113/72 109/74  Pulse: 92 83 84  Temp: 97.5 F (36.4 C)    TempSrc: Oral    Resp: 18    SpO2: 97% 98% 98%   Filed Vitals:   07/28/15 1500 07/28/15 1515 07/28/15 1530 07/28/15 1545  BP: 136/73 137/81 149/88 125/70  Pulse: 90 86 90   Temp:      TempSrc:      Resp:      SpO2: 98% 98% 98%       Anselm PancoastShawn C Ashla Murph, PA-C 07/28/15 1717  Alvira MondayErin Schlossman, MD 07/29/15 1311

## 2015-07-29 ENCOUNTER — Inpatient Hospital Stay (HOSPITAL_COMMUNITY): Payer: Medicare Other | Admitting: Certified Registered"

## 2015-07-29 ENCOUNTER — Encounter (HOSPITAL_COMMUNITY)
Admission: EM | Disposition: A | Discharge status: Home / Self Care | Payer: Self-pay | Source: Home / Self Care | Attending: Emergency Medicine

## 2015-07-29 ENCOUNTER — Encounter (HOSPITAL_COMMUNITY): Payer: Self-pay | Admitting: Certified Registered"

## 2015-07-29 ENCOUNTER — Inpatient Hospital Stay (HOSPITAL_COMMUNITY): Payer: Medicare Other

## 2015-07-29 DIAGNOSIS — I2510 Atherosclerotic heart disease of native coronary artery without angina pectoris: Secondary | ICD-10-CM | POA: Diagnosis not present

## 2015-07-29 DIAGNOSIS — M4806 Spinal stenosis, lumbar region: Secondary | ICD-10-CM | POA: Diagnosis not present

## 2015-07-29 DIAGNOSIS — K8020 Calculus of gallbladder without cholecystitis without obstruction: Secondary | ICD-10-CM | POA: Diagnosis not present

## 2015-07-29 DIAGNOSIS — M961 Postlaminectomy syndrome, not elsewhere classified: Secondary | ICD-10-CM | POA: Diagnosis not present

## 2015-07-29 DIAGNOSIS — M5116 Intervertebral disc disorders with radiculopathy, lumbar region: Secondary | ICD-10-CM | POA: Diagnosis not present

## 2015-07-29 DIAGNOSIS — M5126 Other intervertebral disc displacement, lumbar region: Secondary | ICD-10-CM | POA: Diagnosis not present

## 2015-07-29 DIAGNOSIS — I10 Essential (primary) hypertension: Secondary | ICD-10-CM | POA: Diagnosis not present

## 2015-07-29 DIAGNOSIS — G8929 Other chronic pain: Secondary | ICD-10-CM | POA: Diagnosis not present

## 2015-07-29 DIAGNOSIS — J410 Simple chronic bronchitis: Secondary | ICD-10-CM | POA: Diagnosis not present

## 2015-07-29 DIAGNOSIS — K219 Gastro-esophageal reflux disease without esophagitis: Secondary | ICD-10-CM | POA: Diagnosis not present

## 2015-07-29 DIAGNOSIS — J449 Chronic obstructive pulmonary disease, unspecified: Secondary | ICD-10-CM | POA: Diagnosis not present

## 2015-07-29 HISTORY — PX: LUMBAR LAMINECTOMY/DECOMPRESSION MICRODISCECTOMY: SHX5026

## 2015-07-29 LAB — COMPREHENSIVE METABOLIC PANEL
ALBUMIN: 3.3 g/dL — AB (ref 3.5–5.0)
ALK PHOS: 52 U/L (ref 38–126)
ALT: 33 U/L (ref 17–63)
ANION GAP: 12 (ref 5–15)
AST: 27 U/L (ref 15–41)
BUN: 12 mg/dL (ref 6–20)
CALCIUM: 9.2 mg/dL (ref 8.9–10.3)
CHLORIDE: 98 mmol/L — AB (ref 101–111)
CO2: 25 mmol/L (ref 22–32)
Creatinine, Ser: 1.39 mg/dL — ABNORMAL HIGH (ref 0.61–1.24)
GFR calc non Af Amer: 51 mL/min — ABNORMAL LOW (ref >60)
GFR, EST AFRICAN AMERICAN: 59 mL/min — AB (ref >60)
GLUCOSE: 146 mg/dL — AB (ref 65–99)
Potassium: 4.2 mmol/L (ref 3.5–5.1)
SODIUM: 135 mmol/L (ref 135–145)
Total Bilirubin: 0.9 mg/dL (ref 0.3–1.2)
Total Protein: 6.6 g/dL (ref 6.5–8.1)

## 2015-07-29 LAB — PROTIME-INR
INR: 1.19 (ref 0.00–1.49)
PROTHROMBIN TIME: 15.3 s — AB (ref 11.6–15.2)

## 2015-07-29 LAB — CBC WITH DIFFERENTIAL/PLATELET
BASOS PCT: 0 %
Basophils Absolute: 0 10*3/uL (ref 0.0–0.1)
Eosinophils Absolute: 0.1 10*3/uL (ref 0.0–0.7)
Eosinophils Relative: 2 %
HEMATOCRIT: 41.1 % (ref 39.0–52.0)
HEMOGLOBIN: 14.1 g/dL (ref 13.0–17.0)
LYMPHS ABS: 2.3 10*3/uL (ref 0.7–4.0)
LYMPHS PCT: 30 %
MCH: 32 pg (ref 26.0–34.0)
MCHC: 34.3 g/dL (ref 30.0–36.0)
MCV: 93.4 fL (ref 78.0–100.0)
MONO ABS: 0.7 10*3/uL (ref 0.1–1.0)
MONOS PCT: 9 %
NEUTROS ABS: 4.6 10*3/uL (ref 1.7–7.7)
NEUTROS PCT: 59 %
PLATELETS: 189 10*3/uL (ref 150–400)
RBC: 4.4 MIL/uL (ref 4.22–5.81)
RDW: 13.4 % (ref 11.5–15.5)
WBC: 7.8 10*3/uL (ref 4.0–10.5)

## 2015-07-29 LAB — GLUCOSE, CAPILLARY
GLUCOSE-CAPILLARY: 131 mg/dL — AB (ref 65–99)
GLUCOSE-CAPILLARY: 134 mg/dL — AB (ref 65–99)
GLUCOSE-CAPILLARY: 158 mg/dL — AB (ref 65–99)
GLUCOSE-CAPILLARY: 190 mg/dL — AB (ref 65–99)
Glucose-Capillary: 159 mg/dL — ABNORMAL HIGH (ref 65–99)
Glucose-Capillary: 152 mg/dL — ABNORMAL HIGH (ref 65–99)

## 2015-07-29 LAB — MRSA PCR SCREENING: MRSA by PCR: NEGATIVE

## 2015-07-29 SURGERY — LUMBAR LAMINECTOMY/DECOMPRESSION MICRODISCECTOMY 1 LEVEL
Anesthesia: General | Site: Back | Laterality: Left

## 2015-07-29 MED ORDER — BUPIVACAINE HCL (PF) 0.25 % IJ SOLN
INTRAMUSCULAR | Status: DC | PRN
  Administered 2015-07-29: 15 mL

## 2015-07-29 MED ORDER — THROMBIN 5000 UNITS EX SOLR
CUTANEOUS | Status: DC | PRN
  Administered 2015-07-29 (×2): 5000 [IU] via TOPICAL

## 2015-07-29 MED ORDER — PHENYLEPHRINE HCL 10 MG/ML IJ SOLN
INTRAMUSCULAR | Status: DC | PRN
  Administered 2015-07-29 (×2): 80 ug via INTRAVENOUS
  Administered 2015-07-29: 40 ug via INTRAVENOUS

## 2015-07-29 MED ORDER — LACTATED RINGERS IV SOLN
INTRAVENOUS | Status: DC | PRN
  Administered 2015-07-29 (×2): via INTRAVENOUS

## 2015-07-29 MED ORDER — THROMBIN 5000 UNITS EX SOLR
OROMUCOSAL | Status: DC | PRN
  Administered 2015-07-29: 14:00:00 via TOPICAL

## 2015-07-29 MED ORDER — ROCURONIUM BROMIDE 100 MG/10ML IV SOLN
INTRAVENOUS | Status: DC | PRN
  Administered 2015-07-29: 40 mg via INTRAVENOUS

## 2015-07-29 MED ORDER — TIOTROPIUM BROMIDE MONOHYDRATE 18 MCG IN CAPS
18.0000 ug | ORAL_CAPSULE | Freq: Every day | RESPIRATORY_TRACT | Status: DC
  Filled 2015-07-29: qty 5

## 2015-07-29 MED ORDER — LIDOCAINE 2% (20 MG/ML) 5 ML SYRINGE
INTRAMUSCULAR | Status: AC
  Filled 2015-07-29: qty 5

## 2015-07-29 MED ORDER — LIDOCAINE-EPINEPHRINE 2 %-1:100000 IJ SOLN
INTRAMUSCULAR | Status: DC | PRN
  Administered 2015-07-29: 15 mL

## 2015-07-29 MED ORDER — FENTANYL CITRATE (PF) 250 MCG/5ML IJ SOLN
INTRAMUSCULAR | Status: AC
Start: 2015-07-29 — End: 2015-07-29
  Filled 2015-07-29: qty 5

## 2015-07-29 MED ORDER — MIDAZOLAM HCL 5 MG/5ML IJ SOLN
INTRAMUSCULAR | Status: DC | PRN
  Administered 2015-07-29: 2 mg via INTRAVENOUS

## 2015-07-29 MED ORDER — MIDAZOLAM HCL 2 MG/2ML IJ SOLN
INTRAMUSCULAR | Status: AC
  Filled 2015-07-29: qty 2

## 2015-07-29 MED ORDER — LIDOCAINE HCL (CARDIAC) 20 MG/ML IV SOLN
INTRAVENOUS | Status: DC | PRN
  Administered 2015-07-29: 60 mg via INTRAVENOUS

## 2015-07-29 MED ORDER — TIOTROPIUM BROMIDE MONOHYDRATE 18 MCG IN CAPS
18.0000 ug | ORAL_CAPSULE | Freq: Every day | RESPIRATORY_TRACT | Status: DC
  Administered 2015-07-30: 18 ug via RESPIRATORY_TRACT
  Filled 2015-07-29: qty 5

## 2015-07-29 MED ORDER — ROCURONIUM BROMIDE 50 MG/5ML IV SOLN
INTRAVENOUS | Status: AC
  Filled 2015-07-29: qty 1

## 2015-07-29 MED ORDER — HEMOSTATIC AGENTS (NO CHARGE) OPTIME
TOPICAL | Status: DC | PRN
  Administered 2015-07-29: 1 via TOPICAL

## 2015-07-29 MED ORDER — ONDANSETRON HCL 4 MG/2ML IJ SOLN
INTRAMUSCULAR | Status: AC
  Filled 2015-07-29: qty 2

## 2015-07-29 MED ORDER — CEFAZOLIN SODIUM 1 G IJ SOLR
INTRAMUSCULAR | Status: AC
  Filled 2015-07-29: qty 20

## 2015-07-29 MED ORDER — SODIUM CHLORIDE 0.9 % IR SOLN
Status: DC | PRN
  Administered 2015-07-29: 14:00:00

## 2015-07-29 MED ORDER — 0.9 % SODIUM CHLORIDE (POUR BTL) OPTIME
TOPICAL | Status: DC | PRN
  Administered 2015-07-29: 1000 mL

## 2015-07-29 MED ORDER — SUCCINYLCHOLINE CHLORIDE 200 MG/10ML IV SOSY
PREFILLED_SYRINGE | INTRAVENOUS | Status: AC
  Filled 2015-07-29: qty 10

## 2015-07-29 MED ORDER — BUPIVACAINE LIPOSOME 1.3 % IJ SUSP
20.0000 mL | INTRAMUSCULAR | Status: AC
  Filled 2015-07-29: qty 20

## 2015-07-29 MED ORDER — FENTANYL CITRATE (PF) 100 MCG/2ML IJ SOLN
INTRAMUSCULAR | Status: DC | PRN
  Administered 2015-07-29: 150 ug via INTRAVENOUS

## 2015-07-29 MED ORDER — PROPOFOL 10 MG/ML IV BOLUS
INTRAVENOUS | Status: DC | PRN
  Administered 2015-07-29: 120 mg via INTRAVENOUS

## 2015-07-29 MED ORDER — POLYETHYLENE GLYCOL 3350 17 G PO PACK
17.0000 g | PACK | Freq: Every day | ORAL | Status: DC
  Administered 2015-07-29: 17 g via ORAL
  Filled 2015-07-29 (×2): qty 1

## 2015-07-29 MED ORDER — CEFAZOLIN SODIUM 1 G IJ SOLR
INTRAMUSCULAR | Status: DC | PRN
  Administered 2015-07-29: 2 g via INTRAMUSCULAR

## 2015-07-29 MED ORDER — NICOTINE 21 MG/24HR TD PT24
21.0000 mg | MEDICATED_PATCH | Freq: Every day | TRANSDERMAL | Status: DC
  Administered 2015-07-29: 21 mg via TRANSDERMAL
  Filled 2015-07-29 (×2): qty 1

## 2015-07-29 MED ORDER — PROPOFOL 10 MG/ML IV BOLUS
INTRAVENOUS | Status: AC
  Filled 2015-07-29: qty 20

## 2015-07-29 MED ORDER — BUPIVACAINE LIPOSOME 1.3 % IJ SUSP
INTRAMUSCULAR | Status: DC | PRN
  Administered 2015-07-29: 20 mL

## 2015-07-29 MED ORDER — ONDANSETRON HCL 4 MG/2ML IJ SOLN
INTRAMUSCULAR | Status: DC | PRN
  Administered 2015-07-29: 4 mg via INTRAVENOUS

## 2015-07-29 SURGICAL SUPPLY — 66 items
BAG DECANTER FOR FLEXI CONT (MISCELLANEOUS) ×3 IMPLANT
BENZOIN TINCTURE PRP APPL 2/3 (GAUZE/BANDAGES/DRESSINGS) IMPLANT
BIT DRILL NEURO 2X3.1 SFT TUCH (MISCELLANEOUS) ×1 IMPLANT
BLADE CLIPPER SURG (BLADE) ×3 IMPLANT
BLADE SURG 11 STRL SS (BLADE) ×3 IMPLANT
BUR ROUND FLUTED 5 RND (BURR) ×2 IMPLANT
BUR ROUND FLUTED 5MM RND (BURR) ×1
CANISTER SUCT 3000ML PPV (MISCELLANEOUS) ×3 IMPLANT
CHLORAPREP W/TINT 26ML (MISCELLANEOUS) ×3 IMPLANT
CLOSURE WOUND 1/2 X4 (GAUZE/BANDAGES/DRESSINGS)
DECANTER SPIKE VIAL GLASS SM (MISCELLANEOUS) ×6 IMPLANT
DERMABOND ADVANCED (GAUZE/BANDAGES/DRESSINGS) ×2
DERMABOND ADVANCED .7 DNX12 (GAUZE/BANDAGES/DRESSINGS) ×1 IMPLANT
DRAPE C-ARM 42X72 X-RAY (DRAPES) ×6 IMPLANT
DRAPE MICROSCOPE LEICA (MISCELLANEOUS) ×3 IMPLANT
DRAPE POUCH INSTRU U-SHP 10X18 (DRAPES) ×3 IMPLANT
DRAPE SURG 17X23 STRL (DRAPES) ×3 IMPLANT
DRILL NEURO 2X3.1 SOFT TOUCH (MISCELLANEOUS) ×3
DRSG OPSITE POSTOP 3X4 (GAUZE/BANDAGES/DRESSINGS) ×3 IMPLANT
ELECT BLADE 6.5 EXT (BLADE) ×3 IMPLANT
ELECT REM PT RETURN 9FT ADLT (ELECTROSURGICAL) ×3
ELECTRODE REM PT RTRN 9FT ADLT (ELECTROSURGICAL) ×1 IMPLANT
GAUZE SPONGE 4X4 12PLY STRL (GAUZE/BANDAGES/DRESSINGS) IMPLANT
GAUZE SPONGE 4X4 16PLY XRAY LF (GAUZE/BANDAGES/DRESSINGS) IMPLANT
GLOVE BIOGEL PI IND STRL 7.0 (GLOVE) ×4 IMPLANT
GLOVE BIOGEL PI IND STRL 7.5 (GLOVE) ×1 IMPLANT
GLOVE BIOGEL PI INDICATOR 7.0 (GLOVE) ×8
GLOVE BIOGEL PI INDICATOR 7.5 (GLOVE) ×2
GLOVE ECLIPSE 9.0 STRL (GLOVE) ×3 IMPLANT
GLOVE EXAM NITRILE LRG STRL (GLOVE) IMPLANT
GLOVE EXAM NITRILE MD LF STRL (GLOVE) IMPLANT
GLOVE EXAM NITRILE XL STR (GLOVE) IMPLANT
GLOVE EXAM NITRILE XS STR PU (GLOVE) IMPLANT
GLOVE SS BIOGEL STRL SZ 7 (GLOVE) ×2 IMPLANT
GLOVE SUPERSENSE BIOGEL SZ 7 (GLOVE) ×4
GOWN STRL REUS W/ TWL LRG LVL3 (GOWN DISPOSABLE) ×2 IMPLANT
GOWN STRL REUS W/ TWL XL LVL3 (GOWN DISPOSABLE) ×1 IMPLANT
GOWN STRL REUS W/TWL LRG LVL3 (GOWN DISPOSABLE) ×4
GOWN STRL REUS W/TWL XL LVL3 (GOWN DISPOSABLE) ×2
HEMOSTAT POWDER KIT SURGIFOAM (HEMOSTASIS) ×3 IMPLANT
KIT BASIN OR (CUSTOM PROCEDURE TRAY) ×3 IMPLANT
KIT ROOM TURNOVER OR (KITS) ×3 IMPLANT
LIGHT SOURCE ANGLE TIP STR 7FT (MISCELLANEOUS) ×3 IMPLANT
NEEDLE HYPO 21X1.5 SAFETY (NEEDLE) ×6 IMPLANT
NEEDLE HYPO 25X1 1.5 SAFETY (NEEDLE) IMPLANT
NEEDLE SPNL 18GX3.5 QUINCKE PK (NEEDLE) ×6 IMPLANT
NS IRRIG 1000ML POUR BTL (IV SOLUTION) ×3 IMPLANT
PACK LAMINECTOMY NEURO (CUSTOM PROCEDURE TRAY) ×3 IMPLANT
PACK UNIVERSAL I (CUSTOM PROCEDURE TRAY) ×3 IMPLANT
PAD ARMBOARD 7.5X6 YLW CONV (MISCELLANEOUS) ×9 IMPLANT
PATTIES SURGICAL .5X1.5 (GAUZE/BANDAGES/DRESSINGS) ×3 IMPLANT
RUBBERBAND STERILE (MISCELLANEOUS) ×6 IMPLANT
SPONGE SURGIFOAM ABS GEL SZ50 (HEMOSTASIS) ×3 IMPLANT
STRIP CLOSURE SKIN 1/2X4 (GAUZE/BANDAGES/DRESSINGS) IMPLANT
SUT VIC AB 0 CT1 18XCR BRD8 (SUTURE) ×1 IMPLANT
SUT VIC AB 0 CT1 8-18 (SUTURE) ×2
SUT VIC AB 2-0 CT1 18 (SUTURE) ×3 IMPLANT
SUT VIC AB 3-0 SH 8-18 (SUTURE) ×3 IMPLANT
SUT VIC AB 4-0 PS2 27 (SUTURE) ×3 IMPLANT
SYR 20CC LL (SYRINGE) ×3 IMPLANT
SYR 30ML LL (SYRINGE) ×3 IMPLANT
TOWEL OR 17X24 6PK STRL BLUE (TOWEL DISPOSABLE) IMPLANT
TOWEL OR 17X26 10 PK STRL BLUE (TOWEL DISPOSABLE) ×3 IMPLANT
TUBE CONNECTING 12'X1/4 (SUCTIONS)
TUBE CONNECTING 12X1/4 (SUCTIONS) IMPLANT
WATER STERILE IRR 1000ML POUR (IV SOLUTION) ×3 IMPLANT

## 2015-07-29 NOTE — Op Note (Signed)
07/28/2015 - 07/29/2015  2:40 PM  PATIENT:  Howard RidgeJohn W Dixon  70 y.o. 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 Xaine Sansom, MD  ASSISTANTS: Julio SicksHenry Pool, M.D.  ANESTHESIA:   General   DRAINS: None   SPECIMEN:  None  INDICATION FOR PROCEDURE: 70 year old man with subacute on chronic lumbar radiculopathy due to herniated nucleus pulposus at L2-3 on the left. Patient understood the risks, benefits, and alternatives and potential outcomes and wished to proceed.  PROCEDURE DETAILS: After smooth induction of general endotracheal anesthesia the patient was turned prone onto a Wilson frame. Skin was clipped of hair and wiped down with alcohol. Lidocaine and Marcaine with epinephrine was injected in the subcutaneous tissues just left of midline approximately at approximately L2-3. The patient was then prepped and draped in the usual sterile fashion.  Using lateral fluoroscopy and a spinal needle the L2-3 level was localized. A 3-4 cm incision was made around the point it was localized with the needle. Dissection was performed with monopolar cautery down to the level of the fascia. The fascia was opened over half centimeter area. A tubular dilator was inserted through the fascial opening and rotated to split the muscles on the approach to the lamina at L2-L3. I confirmed the level using lateral fluoroscopy. I sequentially dilated with 2 more dilators. I confirmed position again. I then used the dilators to gently strip the muscle from the lamina at L2-L3 on the left. I inserted a Maxcess retractor and opened it to provide adequate visualization. The microscope was then brought into the field to provide illumination and magnification. Next  Using microsurgical technique I performed an inferior L2 and superior L3 laminotomy on the left. I resected ligamentum flavum to  expose the dura. I medialized the thecal sac and found a large partially calcified disc herniation. Using pituitary rongeurs and curettes I was able to remove this. After removal of all of the disc I palpated the ventral surface of the thecal sac and found that there was good decompression. I obtained hemostasis. I irrigated vigorously with bacitracin saline. I removed the retractor. I injected Exparel and the paraspinal muscles. The wound was closed in routine anatomic layers with interrupted Vicryl sutures. The skin was sealed with Dermabond. The patient woke up uneventfully.  PATIENT DISPOSITION:  PACU - hemodynamically stable.   Delay start of Pharmacological VTE agent (>24hrs) due to surgical blood loss or risk of bleeding:  yes

## 2015-07-29 NOTE — Progress Notes (Signed)
No acute events Moving legs well Stable Back to floor after recovery from anesthesia

## 2015-07-29 NOTE — Anesthesia Procedure Notes (Signed)
Procedure Name: Intubation Date/Time: 07/29/2015 12:53 PM Performed by: Charm BargesBUTLER, Ronda Kazmi R Pre-anesthesia Checklist: Patient identified, Emergency Drugs available, Suction available and Patient being monitored Patient Re-evaluated:Patient Re-evaluated prior to inductionOxygen Delivery Method: Circle System Utilized Preoxygenation: Pre-oxygenation with 100% oxygen Intubation Type: IV induction Ventilation: Mask ventilation without difficulty Laryngoscope Size: Mac and 4 Grade View: Grade II Tube type: Oral Tube size: 8.0 mm Number of attempts: 1 Airway Equipment and Method: Stylet Placement Confirmation: ETT inserted through vocal cords under direct vision,  positive ETCO2 and breath sounds checked- equal and bilateral Secured at: 22 cm Tube secured with: Tape Dental Injury: Teeth and Oropharynx as per pre-operative assessment

## 2015-07-29 NOTE — Progress Notes (Signed)
PROGRESS NOTE  Howard Dixon  WJX:[id number] DOB: 1945-12-26 DOA: 07/28/2015 PCP: [facility] Outpatient Specialists:  None  Brief Narrative:   Howard Dixon is a 70 y.o. male with medical history significant of diabetes mellitus type 2, chronic kidney disease, hypertension, hyperlipidemia and CAD presented to the ER because of worsening low back pain radiating to his left leg. Patient stated his low back pain started around January of this year which has gradually worsened over the last few months. Denied any trauma or fall. Over the last 1 week patient found it increasingly difficult to walk. Denied any incontinence of urine or bowels. MRI of the lumbosacral spine showed disc extrusion at L2-L3 level with spinal stenosis. On-call neurosurgeon Dr.Ditty was consulted.  07/29/2015:  Left L2-3 microdiscectomy by Dr. Bevely Palmer  Assessment & Plan:   Principal Problem:   Lumbar disc herniation with radiculopathy Active Problems:   Hypertension   COPD (chronic obstructive pulmonary disease) (HCC)   Spinal stenosis   Type 2 diabetes mellitus with hyperglycemia (HCC)   Low back pain  Disc extrusion at L2-L3 level with spinal stenosis  -  Appreciate neurosurgery Dr. Bevely Palmer assistance -  Left L2-3 microdiscectomy today  Diabetes mellitus type 2 with hyperglycemia - patient states his Lantus insulin and metformin was recently discontinued 3 weeks ago by his primary care physician since his hemoglobin A1c was around 5.7. At this time patient is only on Glucotrol. - SSI  CAD, chest pain free - Restart aspirin after surgery as per neurosurgery -  Continue beta blocker and statin  Hypertension, blood pressure wnl -  Continue metoprolol -  Resume HCTZ as blood pressure requires  Hyperlipidemia, stable, continue statins.  Chronic kidney disease stage III - creatinine stable around 1.4  COPD, coughing with mild wheeze, at risk for respiratory distress and pneumonia post-op -  Nebulizer  treatments prn -  Start spiriva -  Flutter valve  Cigarette nicotine dependence with withdrawal -  Nicotine patch and encouraged cessation   DVT prophylaxis:  SCDs Code Status:  full Family Communication:  Patient and extended family at bedside Disposition Plan:  Pending recovery from surgery, PT/OT assessments in about 2 days   Consultants:   Neurosurgery, Dr. Bevely Palmer  Procedures:  07/29/2015:  Left L2-3 microdiscectomy by Dr. Bevely Palmer  Antimicrobials:  none  Subjective: Too much pain to put weight on the left leg recently.  Has chronic cough and doesn't want to give up smoking.     Objective: Filed Vitals:   07/29/15 1500 07/29/15 1519 07/29/15 1521 07/29/15 1804  BP:   119/66 121/69  Pulse: 83 78 81 87  Temp:  99 F (37.2 C)  97.7 F (36.5 C)  TempSrc:    Oral  Resp: Height:      Weight:      SpO2: 100% 99% 99% 98%    Intake/Output Summary (Last 24 hours) at 07/29/15 1859 Last data filed at 07/29/15 1807  Gross per 24 hour  Intake   1223 ml  Output     25 ml  Net   1198 ml   Filed Weights   07/28/15 2100  Weight: 72.757 kg (160 lb 6.4 oz)    Examination:  General exam:  Adult male.  No acute distress.   HEENT:  NCAT, MMM Respiratory system: diminished bilateral BS with rhonchorous cough, no wheezes, no focal rales.  Cardiovascular system: S1 & S2 heard, RRR. No JVD, murmurs, rubs, gallops or  clicks.  Warm extremities Gastrointestinal system: Abdomen is nondistended, soft and nontender. No organomegaly or masses felt. Normal bowel sounds heard. MSK:  Normal tone and bulk, no lower extremity edema Neuro:  5/5 strength throughout, SITLT Psychiatry: Judgement and insight appear normal. Mood & affect appropriate.     Data Reviewed: I have personally reviewed following labs and imaging studies  CBC:  Recent Labs Lab 07/28/15 1114 07/29/15 0418  WBC 9.6 7.8  NEUTROABS 6.1 4.6  HGB 15.3 14.1  HCT 44.4 41.1  MCV 92.3 93.4  PLT 214 189    Basic Metabolic Panel:  Recent Labs Lab 07/28/15 1114 07/29/15 0418  NA 133* 135  K 4.5 4.2  CL 94* 98*  CO2 27 25  GLUCOSE 221* 146*  BUN 11 12  CREATININE 1.42* 1.39*  CALCIUM 10.0 9.2   GFR: Estimated Creatinine Clearance: 45.9 mL/min (by C-G formula based on Cr of 1.39). Liver Function Tests:  Recent Labs Lab 07/28/15 1114 07/29/15 0418  AST 31 27  ALT 38 33  ALKPHOS 63 52  BILITOT 1.4* 0.9  PROT 7.8 6.6  ALBUMIN 3.8 3.3*   No results for input(s): LIPASE, AMYLASE in the last 168 hours. No results for input(s): AMMONIA in the last 168 hours. Coagulation Profile:  Recent Labs Lab 07/29/15 0418  INR 1.19   Cardiac Enzymes: No results for input(s): CKTOTAL, CKMB, CKMBINDEX, TROPONINI in the last 168 hours. BNP (last 3 results) No results for input(s): PROBNP in the last 8760 hours. HbA1C: No results for input(s): HGBA1C in the last 72 hours. CBG:  Recent Labs Lab 07/29/15 0358 07/29/15 0810 07/29/15 1201 07/29/15 1441 07/29/15 1715  GLUCAP 159* 152* 131* 134* 158*   Lipid Profile: No results for input(s): CHOL, HDL, LDLCALC, TRIG, CHOLHDL, LDLDIRECT in the last 72 hours. Thyroid Function Tests: No results for input(s): TSH, T4TOTAL, FREET4, T3FREE, THYROIDAB in the last 72 hours. Anemia Panel: No results for input(s): VITAMINB12, FOLATE, FERRITIN, TIBC, IRON, RETICCTPCT in the last 72 hours. Urine analysis:    Component Value Date/Time   COLORURINE YELLOW 07/28/2015 1020   APPEARANCEUR CLEAR 07/28/2015 1020   LABSPEC 1.012 07/28/2015 1020   PHURINE 5.5 07/28/2015 1020   GLUCOSEU 250* 07/28/2015 1020   HGBUR SMALL* 07/28/2015 1020   BILIRUBINUR NEGATIVE 07/28/2015 1020   KETONESUR NEGATIVE 07/28/2015 1020   PROTEINUR NEGATIVE 07/28/2015 1020   NITRITE NEGATIVE 07/28/2015 1020   LEUKOCYTESUR NEGATIVE 07/28/2015 1020   Sepsis Labs: @LABRCNTIP (procalcitonin:4,lacticidven:4)  ) Recent Results (from the past 240 hour(s))  MRSA PCR  Screening     Status: None   Collection Time: 07/28/15  9:40 PM  Result Value Ref Range Status   MRSA by PCR NEGATIVE NEGATIVE Final    Comment:        The GeneXpert MRSA Assay (FDA approved for NASAL specimens only), is one component of a comprehensive MRSA colonization surveillance program. It is not intended to diagnose MRSA infection nor to guide or monitor treatment for MRSA infections.       Radiology Studies: Dg Chest 2 View  07/28/2015  CLINICAL DATA:  Pre-op for lumbar micro discectomy. EXAM: CHEST  2 VIEW COMPARISON:  Limited correlation made with abdominal CT 07/28/2015. FINDINGS: The heart size and mediastinal contours are normal. The lungs are clear. There is mild central airway thickening. There is no pleural effusion or pneumothorax. No acute osseous findings are identified. IMPRESSION: Mild central airway thickening attributed to smoking. No acute cardiopulmonary process. Electronically Signed   By: Chrissie Noa  Purcell MoutonVeazey M.D.   On: 07/28/2015 19:54   Mr Lumbar Spine Wo Contrast  07/28/2015  CLINICAL DATA:  Low back and groin pain. Decreased ability to ambulate. EXAM: MRI LUMBAR SPINE WITHOUT CONTRAST TECHNIQUE: Multiplanar, multisequence MR imaging of the lumbar spine was performed. No intravenous contrast was administered. COMPARISON:  CT abdomen and pelvis 07/28/2015 FINDINGS: There are bilateral L5 pars defects with 11 mm anterolisthesis of L5 on S1. There is severe disc space height loss at L5-S1 with osseous fusion across the disc space. Slight retrolisthesis is noted of L2 on L3 and L3 on L4. Severe disc space narrowing is present at L2-3 with prominent degenerative endplate marrow changes including moderate edema. There is mild disc space narrowing at L3-4. Vertebral body heights are preserved. The conus medullaris is normal in signal and terminates at T12. There is mild nonspecific paraspinal muscular edema bilaterally in the lower lumbar spine. L1-2:  Negative. L2-3:  Circumferential disc bulging with superimposed, large left central disc extrusion which demonstrates caudal migration in the left lateral recess to the mid L3 vertebral body level. Along with mild facet and ligamentum flavum hypertrophy, the disc bulging and extrusion result in severe spinal stenosis, left greater than right lateral recess stenosis, and mild left neural foraminal narrowing. L3-4: Mild disc bulging, small superimposed right paracentral/subarticular disc extrusion with minimal caudal migration, and mild facet and ligamentum flavum hypertrophy result in moderate spinal stenosis, moderate right and mild left lateral recess stenosis, and mild right neural foraminal stenosis. L4-5: Minimal leftward disc bulging and mild-to-moderate facet hypertrophy result in minimal left neural foraminal narrowing. No significant spinal stenosis. L5-S1: Listhesis with disc uncovering and disc space height loss result in severe left greater than right neural foraminal stenosis without significant spinal stenosis. IMPRESSION: 1. Large L2-3 disc extrusion with severe spinal stenosis. 2. Moderate spinal stenosis and right greater than left lateral recess stenosis at L3-4. 3. Grade 2 anterolisthesis of L5 on S1 with severe bilateral foraminal stenosis. Electronically Signed   By: Sebastian AcheAllen  Grady M.D.   On: 07/28/2015 17:35   Ct Abdomen Pelvis W Contrast  07/28/2015  CLINICAL DATA:  Left lower quadrant and low back pain, chronic. No known injury. Initial encounter. EXAM: CT ABDOMEN AND PELVIS WITH CONTRAST TECHNIQUE: Multidetector CT imaging of the abdomen and pelvis was performed using the standard protocol following bolus administration of intravenous contrast. CONTRAST:  100 ISOVUE-300 IOPAMIDOL (ISOVUE-300) INJECTION 61% COMPARISON:  None. FINDINGS: The lung bases are clear. No pleural or pericardial effusion. Calcific coronary artery disease is identified. A single tiny stone is seen in the neck of the gallbladder. There  is no gallbladder wall thickening or surrounding inflammatory change. The liver, adrenal glands, pancreas and spleen appear normal. Tiny hypoattenuating lesion the mid pole of the right kidney is likely a cyst. Mild scarring is seen in the mid and lower pole of the left kidney. Extensive aortoiliac atherosclerosis without aneurysm is identified. The stomach, small and large bowel and appendix appear normal. No lymphadenopathy or fluid is seen. The L5-S1 level is fused with 1 cm anterolisthesis identified. There is marked degenerative disc disease at L3-4 where there is loss of disc space height and endplate spurring. A cystic appearing structure off the medial and inferior aspect of the left facet joint measures 1.2 cm craniocaudal by 1.2 cm transverse by 1.1 cm AP and has partial rim calcification. It causes marked central canal and left lateral recess narrowing. IMPRESSION: No acute abnormality abdomen or pelvis. Lumbar spondylosis appearing worst at  L3-4 there is a large synovial cyst or calcified disc fragment just below the disc interspace in the left lateral recess causing marked central canal stenosis and left lateral recess narrowing. If the patient has radicular symptoms, MRI of the lumbar spine could be used further evaluation. Extensive calcific coronary and aortic atherosclerosis. Single small gallstone without evidence of cholecystitis. Electronically Signed   By: Drusilla Kanner M.D.   On: 07/28/2015 13:42   Dg Lumbar Spine 1 View  07/29/2015  CLINICAL DATA:  Portable imaging for L2-L3 microdiskectomy. 7 seconds fluoro time. EXAM: DG C-ARM 61-120 MIN; LUMBAR SPINE - 1 VIEW COMPARISON:  Lumbar MRI, 07/28/2015 FINDINGS: Single portable lateral view shows the surgical microscope positioned posterior to the lower posterior margin of the L3 vertebra, superimposed on the L3 spinous process. IMPRESSION: Surgical localization imaging for lumbar microdiskectomy as detailed. Electronically Signed   By: Amie Portland M.D.   On: 07/29/2015 15:06   Dg Knee Complete 4 Views Left  07/28/2015  CLINICAL DATA:  Chronic left hip pain EXAM: LEFT KNEE - COMPLETE 4+ VIEW COMPARISON:  None. FINDINGS: The left knee is located. No acute bone or soft tissue abnormality is present. Diffuse atherosclerotic calcifications are present. IMPRESSION: 1. No acute abnormality of the left knee. 2. Atherosclerosis. Electronically Signed   By: Marin Roberts M.D.   On: 07/28/2015 13:41   Dg C-arm 1-60 Min  07/29/2015  CLINICAL DATA:  Portable imaging for L2-L3 microdiskectomy. 7 seconds fluoro time. EXAM: DG C-ARM 61-120 MIN; LUMBAR SPINE - 1 VIEW COMPARISON:  Lumbar MRI, 07/28/2015 FINDINGS: Single portable lateral view shows the surgical microscope positioned posterior to the lower posterior margin of the L3 vertebra, superimposed on the L3 spinous process. IMPRESSION: Surgical localization imaging for lumbar microdiskectomy as detailed. Electronically Signed   By: Amie Portland M.D.   On: 07/29/2015 15:06   Dg Hip Unilat With Pelvis 2-3 Views Left  07/28/2015  CLINICAL DATA:  Chronic left hip and knee pain over the last 5 months. EXAM: DG HIP (WITH OR WITHOUT PELVIS) 2-3V LEFT COMPARISON:  Left hip radiographs 05/11/2015. FINDINGS: Mild degenerative changes are present in the lower lumbar spine. Atherosclerotic calcifications are seen bilaterally. Spells is intact. The left hip is located. No acute bone or soft tissue abnormality is present. IMPRESSION: 1. No acute abnormality or significant interval change. 2. Atherosclerosis. Electronically Signed   By: Marin Roberts M.D.   On: 07/28/2015 13:40     Scheduled Meds: . atorvastatin  10 mg Oral q1800  . bupivacaine liposome  20 mL Infiltration To NeurOR  . cholecalciferol  1,000 Units Oral Daily  . docusate sodium  100 mg Oral BID  . famotidine  20 mg Oral BID  . insulin aspart  0-9 Units Subcutaneous Q4H  . loratadine  10 mg Oral Daily  . metoprolol  50 mg Oral BID    . nicotine  21 mg Transdermal Daily  . pantoprazole (PROTONIX) IV  40 mg Intravenous QHS  . senna  1 tablet Oral BID  . sodium chloride flush  3 mL Intravenous Q12H   Continuous Infusions:    LOS: 1 day    Time spent: 30 min    Renae Fickle, MD [facility] Pager [phone]  If 7PM-7AM, please contact night-coverage [email or web address] Password [email or web address] 07/29/2015, 6:59 PM

## 2015-07-29 NOTE — Anesthesia Preprocedure Evaluation (Addendum)
Anesthesia Evaluation  Patient identified by MRN, date of birth, ID band Patient awake    Reviewed: Allergy & Precautions, NPO status , Patient's Chart, lab work & pertinent test results  Airway Mallampati: III  TM Distance: <3 FB Neck ROM: Full    Dental  (+) Edentulous Upper, Edentulous Lower, Dental Advisory Given   Pulmonary COPD, Current Smoker,    breath sounds clear to auscultation       Cardiovascular hypertension, Pt. on medications and Pt. on home beta blockers + CAD   Rhythm:Regular Rate:Normal     Neuro/Psych    GI/Hepatic Neg liver ROS, GERD  ,  Endo/Other  diabetes, Well Controlled, Type 2, Oral Hypoglycemic Agents  Renal/GU negative Renal ROS     Musculoskeletal   Abdominal   Peds  Hematology   Anesthesia Other Findings   Reproductive/Obstetrics                            Anesthesia Physical Anesthesia Plan  ASA: III  Anesthesia Plan: General   Post-op Pain Management:    Induction: Intravenous  Airway Management Planned: Oral ETT  Additional Equipment: None  Intra-op Plan:   Post-operative Plan: Extubation in OR  Informed Consent: I have reviewed the patients History and Physical, chart, labs and discussed the procedure including the risks, benefits and alternatives for the proposed anesthesia with the patient or authorized representative who has indicated his/her understanding and acceptance.   Dental advisory given  Plan Discussed with: CRNA, Anesthesiologist and Surgeon  Anesthesia Plan Comments:         Anesthesia Quick Evaluation

## 2015-07-29 NOTE — H&P (Signed)
CC:  Chief Complaint  Patient presents with  . Constipation    HPI: Howard Dixon is a 70 y.o. male with five months of back pain radiating to the left leg that worsened dramatically over the last three weeks.  It has limited his ability to ambulate.  He has been voiding normally.  He was seen in the ER last night and found to have a large left sided L2-3 disc herniation.  He was admitted for surgical intervention today.  PMH: Past Medical History  Diagnosis Date  . Allergic rhinitis due to pollen   . Coronary artery disease, occlusive   . Hyperlipidemia   . Hypertension   . Type II or unspecified type diabetes mellitus with neurological manifestations, not stated as uncontrolled   . GERD (gastroesophageal reflux disease)   . COPD (chronic obstructive pulmonary disease) (HCC)     PSH: Past Surgical History  Procedure Laterality Date  . Hernia repair    . Back surgery      SH: Social History  Substance Use Topics  . Smoking status: Current Every Day Smoker -- 1.00 packs/day    Types: Cigarettes    Start date: 03/22/1962  . Smokeless tobacco: Never Used  . Alcohol Use: No    MEDS: Prior to Admission medications   Medication Sig Start Date End Date Taking? Authorizing Provider  albuterol (PROVENTIL HFA;VENTOLIN HFA) 108 (90 BASE) MCG/ACT inhaler Inhale 2 puffs into the lungs 4 (four) times daily as needed.   Yes Historical Provider, MD  aspirin 81 MG tablet Take 81 mg by mouth daily.   Yes Historical Provider, MD  atorvastatin (LIPITOR) 10 MG tablet Take 10 mg by mouth daily at 6 PM. NOT SURE OF DOSE   Yes Historical Provider, MD  BD ULTRA-FINE LANCETS lancets 1 each by Other route. Use as instructed   Yes Historical Provider, MD  Cholecalciferol (VITAMIN D-3) 1000 UNITS CAPS Take 1 capsule by mouth daily.   Yes Historical Provider, MD  flunisolide (NASAREL) 29 MCG/ACT (0.025%) nasal spray Place 2 sprays into the nose 2 (two) times daily. Dose is for each nostril.   Yes  Historical Provider, MD  glipiZIDE (GLUCOTROL) 10 MG tablet Take 10 mg by mouth 2 (two) times daily before a meal.   Yes Historical Provider, MD  glucose blood (PRECISION XTRA TEST STRIPS) test strip 1 each by Other route as needed. Use as instructed   Yes Historical Provider, MD  hydrocerin (EUCERIN) CREA Apply 1 application topically 2 (two) times daily.   Yes Historical Provider, MD  hydrochlorothiazide (HYDRODIURIL) 25 MG tablet Take 12.5 mg by mouth daily.   Yes Historical Provider, MD  insulin glargine (LANTUS) 100 UNIT/ML injection Inject 20 Units into the skin at bedtime.   Yes Historical Provider, MD  Insulin Syringe-Needle U-100 (INSULIN SYRINGE .5CC/31GX5/16") 31G X 5/16" 0.5 ML MISC by Does not apply route.   Yes Historical Provider, MD  lisinopril (PRINIVIL,ZESTRIL) 40 MG tablet Take 20 mg by mouth daily.   Yes Historical Provider, MD  loratadine (CLARITIN) 10 MG tablet Take 10 mg by mouth daily.   Yes Historical Provider, MD  metoprolol (LOPRESSOR) 50 MG tablet Take 50 mg by mouth 2 (two) times daily.    Yes Historical Provider, MD  naproxen (NAPROSYN) 500 MG tablet Take 500 mg by mouth 2 (two) times daily with a meal.   Yes Historical Provider, MD  ranitidine (ZANTAC) 150 MG capsule Take 150 mg by mouth 2 (two) times daily.  Yes Historical Provider, MD  metFORMIN (GLUCOPHAGE) 1000 MG tablet Take 1,000 mg by mouth 2 (two) times daily with a meal.    Historical Provider, MD    ALLERGY: Allergies  Allergen Reactions  . Sulfa Antibiotics Rash    ROS: ROS  NEUROLOGIC EXAM: Awake, alert, oriented Memory and concentration grossly intact Speech fluent, appropriate CN grossly intact Motor exam: Upper Extremities Deltoid Bicep Tricep Grip  Right 5/5 5/5 5/5 5/5  Left 5/5 5/5 5/5 5/5   Lower Extremity IP Quad PF DF EHL  Right 5/5 5/5 5/5 5/5 5/5  Left 5/5 5/5 4/5 4/5 4/5   Sensation grossly intact to LT  IMAGING: Large left L2-3 disc herniation with caudal migration of  fragment.  L5-S1 spondylolisthesis with bilateral foraminal stenosis.  IMPRESSION: - 70 y.o. male with subacute on chronic lumbar radiculopathy due to large left L2-3 disc herniation.  Neurological deficits as above.  PLAN: - L2-3 microdiscectomy today - I had a long discussion with the patient in which we discussed the risks and benefits of surgery as well as surgical alternatives.  He wishes to proceed.

## 2015-07-29 NOTE — Transfer of Care (Signed)
Immediate Anesthesia Transfer of Care Note  Patient: Howard Dixon  Procedure(s) Performed: Procedure(s): Left Lumbar two-three microdiscectomy (Left)  Patient Location: PACU  Anesthesia Type:General  Level of Consciousness: sedated and patient cooperative  Airway & Oxygen Therapy: Patient Spontanous Breathing and Patient connected to nasal cannula oxygen  Post-op Assessment: Report given to RN, Post -op Vital signs reviewed and stable and Patient moving all extremities  Post vital signs: Reviewed and stable  Last Vitals:  Filed Vitals:   07/29/15 0522 07/29/15 0956  BP: 103/54 106/60  Pulse: 77 81  Temp: 37.1 C 36.9 C  Resp: 20 20    Last Pain:  Filed Vitals:   07/29/15 1436  PainSc: 0-No pain         Complications: No apparent anesthesia complications

## 2015-07-29 NOTE — Progress Notes (Signed)
PT Cancellation Note  Patient Details Name: Howard RidgeJohn W Sudano MRN: [id number] DOB: June 07, 1945   Cancelled Treatment:    Reason Eval/Treat Not Completed: Other (comment) Pt going for back surgery today. Has been up ambulating with RN. Will follow up post surgery to perform PT eval.    Blake DivineShauna A Maraya Gwilliam 07/29/2015, 9:24 AM Mylo RedShauna Oliviah Agostini, PT, DPT [phone]

## 2015-07-29 NOTE — Progress Notes (Signed)
OT Cancellation Note  Patient Details Name: Howard Dixon MRN: [id number] DOB: 1945-11-09   Cancelled Treatment:    Reason Eval/Treat Not Completed:  (Pt has not had back surgery yet. Will wait for OT evaluation until after back surgery)  Earlie RavelingStraub, Mackenzie Groom L OTR/L [phone] 07/29/2015, 9:54 AM

## 2015-07-29 NOTE — Progress Notes (Signed)
Inpatient Diabetes Program Recommendations  AACE/ADA: New Consensus Statement on Inpatient Glycemic Control (2015)  Target Ranges:  Prepandial:   less than 140 mg/dL      Peak postprandial:   less than 180 mg/dL (1-2 hours)      Critically ill patients:  140 - 180 mg/dL   Review of Glycemic ControlResults for Howard Dixon, Willmar W (MRN [id number]) as of 07/29/2015 09:52  Ref. Range 07/28/2015 21:06 07/28/2015 23:52 07/29/2015 03:58  Glucose-Capillary Latest Ref Range: 65-99 mg/dL 147146 (H) 829248 (H) 562159 (H)   Diabetes history: Type 2 diabetes Outpatient Diabetes medications:  Glipizide 10 mg bid, Lantus 20 units q HS, Metformin 1000 mg bid Current orders for Inpatient glycemic control:  Novolog sensitive q 4 hours  Inpatient Diabetes Program Recommendations:   Please consider adding 1/2 of home dose of Lantus 10 units daily.  Will follow.  Thanks, Beryl MeagerJenny Dietrich Samuelson, RN, BC-ADM Inpatient Diabetes Coordinator Pager [phone] (8a-5p)

## 2015-07-29 NOTE — Care Management Note (Signed)
Case Management Note  Patient Details  Name: Howard Dixon MRN: [id number] Date of Birth: 1945/04/22  Subjective/Objective:                    Action/Plan: Patient presented with low back pain. Lives at home with spouse. Plan is for surgical intervention today.  Will follow for discharge needs pending patient's progress and physician orders.  Expected Discharge Date:                  Expected Discharge Plan:     In-House Referral:     Discharge planning Services     Post Acute Care Choice:    Choice offered to:     DME Arranged:    DME Agency:     HH Arranged:    HH Agency:     Status of Service:  In process, will continue to follow  Medicare Important Message Given:    Date Medicare IM Given:    Medicare IM give by:    Date Additional Medicare IM Given:    Additional Medicare Important Message give by:     If discussed at Long Length of Stay Meetings, dates discussed:    Additional CommentsAnda Kraft:  Maxwell Martorano C, RN 07/29/2015, 10:48 AM [phone]

## 2015-07-29 NOTE — Anesthesia Postprocedure Evaluation (Signed)
Anesthesia Post Note  Patient: Howard RidgeJohn W Dixon  Procedure(s) Performed: Procedure(s) (LRB): Left Lumbar two-three microdiscectomy (Left)  Patient location during evaluation: PACU Anesthesia Type: General Level of consciousness: awake Pain management: pain level controlled Vital Signs Assessment: post-procedure vital signs reviewed and stable Respiratory status: spontaneous breathing Cardiovascular status: stable Anesthetic complications: no    Last Vitals:  Filed Vitals:   07/29/15 1519 07/29/15 1521  BP:  119/66  Pulse: 78 81  Temp: 37.2 C   Resp: 21 21    Last Pain:  Filed Vitals:   07/29/15 1522  PainSc: 0-No pain                 EDWARDS,Sharaya Boruff

## 2015-07-30 ENCOUNTER — Encounter (HOSPITAL_COMMUNITY): Payer: Self-pay | Admitting: Neurological Surgery

## 2015-07-30 DIAGNOSIS — J410 Simple chronic bronchitis: Secondary | ICD-10-CM

## 2015-07-30 DIAGNOSIS — E1165 Type 2 diabetes mellitus with hyperglycemia: Secondary | ICD-10-CM

## 2015-07-30 DIAGNOSIS — M5116 Intervertebral disc disorders with radiculopathy, lumbar region: Secondary | ICD-10-CM

## 2015-07-30 DIAGNOSIS — I10 Essential (primary) hypertension: Secondary | ICD-10-CM | POA: Diagnosis not present

## 2015-07-30 DIAGNOSIS — M4806 Spinal stenosis, lumbar region: Secondary | ICD-10-CM | POA: Diagnosis not present

## 2015-07-30 LAB — GLUCOSE, CAPILLARY
GLUCOSE-CAPILLARY: 100 mg/dL — AB (ref 65–99)
GLUCOSE-CAPILLARY: 160 mg/dL — AB (ref 65–99)
GLUCOSE-CAPILLARY: 207 mg/dL — AB (ref 65–99)
GLUCOSE-CAPILLARY: 212 mg/dL — AB (ref 65–99)

## 2015-07-30 MED ORDER — POLYETHYLENE GLYCOL 3350 17 G PO PACK: 17.0000 g | PACK | Freq: Every day | ORAL | Status: AC

## 2015-07-30 MED ORDER — DOCUSATE SODIUM 100 MG PO CAPS: 100.0000 mg | ORAL_CAPSULE | Freq: Every day | ORAL | Status: AC | PRN

## 2015-07-30 MED ORDER — NICOTINE 21 MG/24HR TD PT24: 21.0000 mg | MEDICATED_PATCH | Freq: Every day | TRANSDERMAL | Status: DC

## 2015-07-30 MED ORDER — METHOCARBAMOL 500 MG PO TABS: 500.0000 mg | ORAL_TABLET | Freq: Three times a day (TID) | ORAL | Status: DC | PRN

## 2015-07-30 NOTE — Care Management Note (Addendum)
Case Management Note  Patient Details  Name: Howard Dixon MRN: [id number] Date of Birth: May 08, 1945  Subjective/Objective:                    Action/Plan: Patient discharging home today with orders for [facility] services. CM met with the patient and provided him a list of [facility] agencies in the [place] area. He selected [place]. Pt currently has [facility] as his PCP but he wants to go back to Dr Viviana Simpler. CM asked Mr Kovalenko if he would like CM to attempt to obtain him an appointment with Dr Silvio Pate. Pt was in agreement. CM called and was able to obtain him an appointment on the 17th of May. Appointment placed on the AVS. Manuela Schwartz with [facility] notified of the referral and she accepted. Pt also ordered a walker. Jermaine with [facility] notified and will deliver the DME to the room.  Bedside RN updated.  Expected Discharge Date:                  Expected Discharge Plan:  [place]  In-House Referral:     Discharge planning Services  CM Consult  Post Acute Care Choice:  Home Health, Durable Medical Equipment Choice offered to:  Patient  DME Arranged:  Walker rolling DME Agency:  [place]:  PT [place]:  [place]  Status of Service:  Completed, signed off  Medicare Important Message Given:    Date Medicare IM Given:    Medicare IM give by:    Date Additional Medicare IM Given:    Additional Medicare Important Message give by:     If discussed at [place] of Stay Meetings, dates discussed:    Additional Comments:  Pollie Friar, RN 07/30/2015, 12:47 PM

## 2015-07-30 NOTE — Progress Notes (Signed)
No acute events Feels well Minimal pain Full strength in lower extremities Incision looks good Ok for d/c from my perspective F/u in 3 weeks

## 2015-07-30 NOTE — Progress Notes (Signed)
D/C orders received, pt for D/C home today with home health.  IV and telemetry D/C.  Rx and D/C instructions given with verbalized understanding.  Family at bedside to assist with D/C.  Staff brought pt downstairs via wheelchair.  

## 2015-07-30 NOTE — Evaluation (Signed)
Physical Therapy Evaluation Patient Details Name: Howard RidgeJohn W Dixon MRN: [id number] DOB: July 08, 1945 Today's Date: 07/30/2015   History of Present Illness  Howard RidgeJohn W Shrader is a 70 y.o. male with medical history significant of diabetes mellitus type 2, chronic kidney disease, hypertension, hyperlipidemia and CAD presented to the ER because of worsening low back pain radiating to his left leg. Patient stated his low back pain started around January of this year which has gradually worsened over the last few months.  MRI of the lumbosacral spine showed disc extrusion at L2-L3 level with spinal stenosis. Pt s/p L2-3 microdiscetomy on 5/9.  Clinical Impression  Patient is s/p above surgery resulting in the deficits listed below (see PT Problem List). Pt denies any numbness or tingling in L LE as PTA.  Patient will benefit from skilled PT to increase their independence and safety with mobility (while adhering to their precautions) to allow discharge to the venue listed below.     Follow Up Recommendations Home health PT;Supervision/Assistance - 24 hour    Equipment Recommendations  Rolling walker with 5" wheels    Recommendations for Other Services       Precautions / Restrictions Precautions Precautions: Back Precaution Booklet Issued: Yes (comment) Restrictions Weight Bearing Restrictions: No      Mobility  Bed Mobility Overal bed mobility: Modified Independent             General bed mobility comments: v/c's for logroll technique, mild increased time but no physical assist required  Transfers Overall transfer level: Needs assistance Equipment used: None Transfers: Sit to/from Stand Sit to Stand: Min guard         General transfer comment: v/c's to push up from bed  Ambulation/Gait Ambulation/Gait assistance: Min guard Ambulation Distance (Feet): 150 Feet (x1, 100x1) Assistive device: Rolling walker (2 wheeled);None Gait Pattern/deviations: Step-through pattern;Decreased  stride length;Wide base of support;Antalgic Gait velocity: slow Gait velocity interpretation: Below normal speed for age/gender General Gait Details: pt amb 150 without AD and was unsteady reaching for hand rail and had L LE limp. pt given RW pt with improved fluidity of gait and step length. Pt reports he fells more steady with RW. v/c's for walker management during turning. pt able to achieve more errect posture with RW as well  Stairs Stairs: Yes Stairs assistance: Min guard Stair Management: Two rails;Step to pattern Number of Stairs: 3 General stair comments: pt with good technique  Wheelchair Mobility    Modified Rankin (Stroke Patients Only)       Balance Overall balance assessment: Needs assistance         Standing balance support: During functional activity   Standing balance comment: pt requires RW for safe long distance amb                             Pertinent Vitals/Pain Pain Assessment: 0-10 Pain Location: 2 Pain Descriptors / Indicators: Operative site guarding Pain Intervention(s): Monitored during session    Home Living Family/patient expects to be discharged to:: Private residence Living Arrangements: Spouse/significant other Available Help at Discharge: Family;Available 24 hours/day Type of Home: Mobile home Home Access: Stairs to enter Entrance Stairs-Rails: Can reach both Entrance Stairs-Number of Steps: 5-6 Home Layout: One level Home Equipment: Wheelchair - manual      Prior Function Level of Independence: Independent               Hand Dominance  Extremity/Trunk Assessment   Upper Extremity Assessment: Overall WFL for tasks assessed           Lower Extremity Assessment: Overall WFL for tasks assessed      Cervical / Trunk Assessment: Normal  Communication   Communication: HOH  Cognition Arousal/Alertness: Awake/alert Behavior During Therapy: WFL for tasks assessed/performed Overall Cognitive  Status: Within Functional Limits for tasks assessed                      General Comments      Exercises        Assessment/Plan    PT Assessment Patient needs continued PT services  PT Diagnosis Difficulty walking;Generalized weakness   PT Problem List Decreased strength;Decreased range of motion;Decreased activity tolerance;Decreased balance;Decreased mobility  PT Treatment Interventions DME instruction;Gait training;Stair training;Functional mobility training;Therapeutic activities;Therapeutic exercise;Balance training   PT Goals (Current goals can be found in the Care Plan section) Acute Rehab PT Goals Patient Stated Goal: home PT Goal Formulation: With patient Time For Goal Achievement: 08/06/15 Potential to Achieve Goals: Fair    Frequency Min 5X/week   Barriers to discharge        Co-evaluation               End of Session Equipment Utilized During Treatment: Gait belt Activity Tolerance: Patient tolerated treatment well Patient left: in chair;with call bell/phone within reach;with chair alarm set;with nursing/sitter in room Nurse Communication: Mobility status    Functional Assessment Tool Used: clinical judgement Functional Limitation: Mobility: Walking and moving around Mobility: Walking and Moving Around Current Status [phone]): At least 20 percent but less than 40 percent impaired, limited or restricted Mobility: Walking and Moving Around Goal Status [phone]): At least 1 percent but less than 20 percent impaired, limited or restricted    Time: 0981-1914 PT Time Calculation (min) (ACUTE ONLY): 21 min   Charges:   PT Evaluation $PT Eval Moderate Complexity: 1 Procedure     PT G Codes:   PT G-Codes **NOT FOR INPATIENT CLASS** Functional Assessment Tool Used: clinical judgement Functional Limitation: Mobility: Walking and moving around Mobility: Walking and Moving Around Current Status ([id number]): At least 20 percent but less than 40 percent  impaired, limited or restricted Mobility: Walking and Moving Around Goal Status [phone]): At least 1 percent but less than 20 percent impaired, limited or restricted    Marcene Brawn 07/30/2015, 9:08 AM   Lewis Shock, PT, DPT Pager #: [phone] Office #: [phone]

## 2015-07-30 NOTE — Evaluation (Signed)
Occupational Therapy Evaluation Patient Details Name: Howard Dixon MRN: [id number] DOB: Feb 21, 1946 Today's Date: 07/30/2015    History of Present Illness Howard Dixon is a 70 y.o. male with medical history significant of diabetes mellitus type 2, chronic kidney disease, hypertension, hyperlipidemia and CAD presented to the ER because of worsening low back pain radiating to his left leg. Patient stated his low back pain started around January of this year which has gradually worsened over the last few months.  MRI of the lumbosacral spine showed disc extrusion at L2-L3 level with spinal stenosis. Pt s/p L2-3 microdiscetomy on 5/9.   Clinical Impression   Patient evaluated by Occupational Therapy with no further acute OT needs identified. All education has been completed and the patient has no further questions. See below for any follow-up Occupational Therapy or equipment needs. OT to sign off. Thank you for referral.   Pt at adequate level for d/c home    Follow Up Recommendations  No OT follow up    Equipment Recommendations  None recommended by OT    Recommendations for Other Services       Precautions / Restrictions Precautions Precautions: Back Precaution Booklet Issued: Yes (comment) Precaution Comments: handout provided and read aloud to patient due to no glass present. pt able to recall 2 out 3 precautions at end of session. Pt with reeducation for arching Restrictions Weight Bearing Restrictions: No      Mobility Bed Mobility Overal bed mobility: Modified Independent             General bed mobility comments: good return demo  Transfers Overall transfer level: Needs assistance Equipment used: None Transfers: Sit to/from Stand Sit to Stand: Min guard;Supervision         General transfer comment: v/c's to push up from bed pt pulling on RW    Balance Overall balance assessment: Needs assistance Sitting-balance support: No upper extremity supported;Feet  supported       Standing balance support: No upper extremity supported;During functional activity   Standing balance comment: pt requires RW for safe long distance amb                            ADL Overall ADL's : Modified independent                                       General ADL Comments: pt able to cross bil LE and completed shower transfer. Educated on side stepping into shower and pt reports feeling safer with side stepping.     Vision     Perception     Praxis      Pertinent Vitals/Pain Pain Assessment: No/denies pain Pain Location: 2 Pain Descriptors / Indicators: Operative site guarding Pain Intervention(s): Monitored during session     Hand Dominance Right   Extremity/Trunk Assessment Upper Extremity Assessment Upper Extremity Assessment: Overall WFL for tasks assessed   Lower Extremity Assessment Lower Extremity Assessment: Overall WFL for tasks assessed   Cervical / Trunk Assessment Cervical / Trunk Assessment: Normal   Communication Communication Communication: HOH   Cognition Arousal/Alertness: Awake/alert Behavior During Therapy: WFL for tasks assessed/performed Overall Cognitive Status: Within Functional Limits for tasks assessed                     General Comments       Exercises  Shoulder Instructions      Home Living Family/patient expects to be discharged to:: Private residence Living Arrangements: Spouse/significant other Available Help at Discharge: Family;Available 24 hours/day Type of Home: Mobile home Home Access: Stairs to enter Entrance Stairs-Number of Steps: 5-6 Entrance Stairs-Rails: Can reach both Home Layout: One level     Bathroom Shower/Tub: Producer, television/film/video: Standard     Home Equipment: Wheelchair - manual          Prior Functioning/Environment Level of Independence: Independent             OT Diagnosis:     OT Problem List:     OT  Treatment/Interventions:      OT Goals(Current goals can be found in the care plan section) Acute Rehab OT Goals Patient Stated Goal: to go home to dog "smokey"  OT Frequency:     Barriers to D/C:            Co-evaluation              End of Session Equipment Utilized During Treatment: Gait belt;Rolling walker Nurse Communication: Mobility status;Precautions  Activity Tolerance: Patient tolerated treatment well Patient left: in bed;with call bell/phone within reach   Time: 1012-1026 OT Time Calculation (min): 14 min Charges:  OT General Charges $OT Visit: 1 Procedure OT Evaluation $OT Eval Moderate Complexity: 1 Procedure G-Codes:    Boone Master B 2015/08/17, 10:44 AM  Mateo Flow   OTR/L Pager: [phone] Office: [phone] .

## 2015-08-01 DIAGNOSIS — I2510 Atherosclerotic heart disease of native coronary artery without angina pectoris: Secondary | ICD-10-CM | POA: Diagnosis not present

## 2015-08-01 DIAGNOSIS — Z7984 Long term (current) use of oral hypoglycemic drugs: Secondary | ICD-10-CM | POA: Diagnosis not present

## 2015-08-01 DIAGNOSIS — I129 Hypertensive chronic kidney disease with stage 1 through stage 4 chronic kidney disease, or unspecified chronic kidney disease: Secondary | ICD-10-CM | POA: Diagnosis not present

## 2015-08-01 DIAGNOSIS — E1122 Type 2 diabetes mellitus with diabetic chronic kidney disease: Secondary | ICD-10-CM | POA: Diagnosis not present

## 2015-08-01 DIAGNOSIS — J449 Chronic obstructive pulmonary disease, unspecified: Secondary | ICD-10-CM | POA: Diagnosis not present

## 2015-08-01 DIAGNOSIS — N189 Chronic kidney disease, unspecified: Secondary | ICD-10-CM | POA: Diagnosis not present

## 2015-08-01 DIAGNOSIS — Z794 Long term (current) use of insulin: Secondary | ICD-10-CM | POA: Diagnosis not present

## 2015-08-01 DIAGNOSIS — E785 Hyperlipidemia, unspecified: Secondary | ICD-10-CM | POA: Diagnosis not present

## 2015-08-01 DIAGNOSIS — Z4789 Encounter for other orthopedic aftercare: Secondary | ICD-10-CM | POA: Diagnosis not present

## 2015-08-01 DIAGNOSIS — K219 Gastro-esophageal reflux disease without esophagitis: Secondary | ICD-10-CM | POA: Diagnosis not present

## 2015-08-03 NOTE — Discharge Summary (Signed)
[facility] Discharge Summary   Patient: Howard Dixon ZOX:[id number]   PCP: Tillman Abide, MD DOB: 12-Jul-1945   Date of admission: 07/28/2015   Date of discharge: 07/30/2015     Discharge Diagnoses:  Principal Problem:   Lumbar disc herniation with radiculopathy Active Problems:   Hypertension   COPD (chronic obstructive pulmonary disease) (HCC)   Spinal stenosis   Type 2 diabetes mellitus with hyperglycemia (HCC)   Low back pain  Recommendations for Outpatient Follow-up:  1.  Follow-up with PCP in one week with blood sugar logs 2. Follow-up with neurosurgery in 3 weeks  Follow-up Information    Follow up with Loura Halt Ditty, MD In 3 weeks.   Specialty:  Neurosurgery   Contact information:   [address] [place] [address] [phone]       Follow up with [facility]. Schedule an appointment as soon as possible for a visit in 1 week.   Specialty:  General Practice   Contact information:   [phone]  [place] [address] [phone]       Follow up with Tillman Abide, MD On 08/06/2015.   Specialties:  Internal Medicine, Pediatrics   Why:  Your appointment is at 10:15 but please arrive at 10:00 with your insurance and current meds.    Contact information:   [address] [place] [address] [phone]      Diet recommendation: Diabetic diet cardiac  Activity: The patient is advised to gradually reintroduce usual activities.  Discharge Condition: good  History of present illness: As per the H and P dictated on admission, "Howard Dixon is a 70 y.o. male with medical history significant of diabetes mellitus type 2, chronic kidney disease, hypertension, hyperlipidemia and CAD presents to the ER because of worsening low back pain radiating to his left leg. Patient states his low back pain started around January of this year which has gradually worsened over the last few months. Denies any trauma or fall. Over the  last 1 week patient is finding it increasingly difficult to walk. Denies any incontinence of urine or bowels. MRI of the lumbosacral spine shows disc extrusion at L2-L3 level with spinal stenosis. On-call neurosurgeon Dr.Ditty has been consulted and patient is scheduled for surgery in the morning.  ED Course: Started on pain relief medications. Patient had MRI and CT of the abdomen. X-rays of the left knee and hip"  Hospital Course:  Summary of his active problems in the hospital is as following. Disc extrusion at L2-L3 level with spinal stenosis  - Appreciate neurosurgery Dr. Bevely Palmer assistance - Left L2-3 microdiscectomy 07/29/2015 -  Patient's pain was well-controlled. PTOT evaluate the patient after the surgery and recommended home health. Neurosurgery felt the patient can be discharged from their perspective.  Diabetes mellitus type 2 with hyperglycemia - patient states his Lantus insulin and metformin was recently discontinued 3 weeks ago by his primary care physician since his hemoglobin A1c was around 5.7. At this time patient is only on Glucotrol. Recommended to follow-up with PCP with blood sugar logs  CAD, chest pain free - Restart aspirin after surgery as per neurosurgery - Continue beta blocker and statin  Hypertension, blood pressure wnl - Continue metoprolol - Holding HCTZ until seen by PCP since per patient is well controlled in the hospital despite stopping this medication.  Hyperlipidemia, stable, continue statins.  Chronic kidney disease stage III - creatinine stable around 1.4  COPD,  Stable  no wheezing.  Cigarette nicotine dependence with withdrawal - Nicotine patch and encouraged cessation  All other chronic medical condition were stable during the hospitalization.  Patient was seen by physical therapy, who recommended home health, which was arranged by Child psychotherapist and case Production designer, theatre/television/film. On the day of the discharge the patient's pain was well-controlled and  vitals are stable, and no other acute medical condition were reported by patient. the patient was felt safe to be discharge at home with home health.  Procedures and Results:  Left L2-3 microdiscectomy 07/29/2015   Consultations:  Neurosurgery  DISCHARGE MEDICATION: Discharge Medication List as of 07/30/2015  2:15 PM    START taking these medications   Details  docusate sodium (COLACE) 100 MG capsule Take 1 capsule (100 mg total) by mouth daily as needed for mild constipation., Starting 07/30/2015, Until Discontinued, Print    methocarbamol (ROBAXIN) 500 MG tablet Take 1 tablet (500 mg total) by mouth every 8 (eight) hours as needed for muscle spasms., Starting 07/30/2015, Until Discontinued, Print    nicotine (NICODERM CQ - DOSED IN MG/24 HOURS) 21 mg/24hr patch Place 1 patch (21 mg total) onto the skin daily., Starting 07/30/2015, Until Discontinued, Print    polyethylene glycol (MIRALAX / GLYCOLAX) packet Take 17 g by mouth daily., Starting 07/30/2015, Until Discontinued, Print      CONTINUE these medications which have NOT CHANGED   Details  albuterol (PROVENTIL HFA;VENTOLIN HFA) 108 (90 BASE) MCG/ACT inhaler Inhale 2 puffs into the lungs 4 (four) times daily as needed., Until Discontinued, Historical Med    aspirin 81 MG tablet Take 81 mg by mouth daily., Until Discontinued, Historical Med    atorvastatin (LIPITOR) 10 MG tablet Take 10 mg by mouth daily at 6 PM. NOT SURE OF DOSE, Until Discontinued, Historical Med    BD ULTRA-FINE LANCETS lancets 1 each by Other route. Use as instructed, Historical Med    Cholecalciferol (VITAMIN D-3) 1000 UNITS CAPS Take 1 capsule by mouth daily., Until Discontinued, Historical Med    flunisolide (NASAREL) 29 MCG/ACT (0.025%) nasal spray Place 2 sprays into the nose 2 (two) times daily. Dose is for each nostril., Until Discontinued, Historical Med    glipiZIDE (GLUCOTROL) 10 MG tablet Take 10 mg by mouth 2 (two) times daily before a meal.,  Until Discontinued, Historical Med    glucose blood (PRECISION XTRA TEST STRIPS) test strip 1 each by Other route as needed. Use as instructed, Historical Med    hydrocerin (EUCERIN) CREA Apply 1 application topically 2 (two) times daily., Until Discontinued, Historical Med    insulin glargine (LANTUS) 100 UNIT/ML injection Inject 20 Units into the skin at bedtime., Until Discontinued, Historical Med    Insulin Syringe-Needle U-100 (INSULIN SYRINGE .5CC/31GX5/16") 31G X 5/16" 0.5 ML MISC by Does not apply route., Until Discontinued, Historical Med    lisinopril (PRINIVIL,ZESTRIL) 40 MG tablet Take 20 mg by mouth daily., Until Discontinued, Historical Med    loratadine (CLARITIN) 10 MG tablet Take 10 mg by mouth daily., Until Discontinued, Historical Med    metoprolol (LOPRESSOR) 50 MG tablet Take 50 mg by mouth 2 (two) times daily. , Until Discontinued, Historical Med    naproxen (NAPROSYN) 500 MG tablet Take 500 mg by mouth 2 (two) times daily with a meal., Until Discontinued, Historical Med    ranitidine (ZANTAC) 150 MG capsule Take 150 mg by mouth 2 (two) times daily., Until Discontinued, Historical Med      STOP taking these medications  hydrochlorothiazide (HYDRODIURIL) 25 MG tablet        Allergies  Allergen Reactions  . Sulfa Antibiotics Rash   Discharge Instructions    Call MD for:  difficulty breathing, headache or visual disturbances    Complete by:  As directed      Call MD for:  persistant dizziness or light-headedness    Complete by:  As directed      Call MD for:  persistant nausea and vomiting    Complete by:  As directed      Call MD for:  redness, tenderness, or signs of infection (pain, swelling, redness, odor or green/yellow discharge around incision site)    Complete by:  As directed      Call MD for:  severe uncontrolled pain    Complete by:  As directed      Call MD for:  temperature >100.4    Complete by:  As directed      Diet - low sodium heart  healthy    Complete by:  As directed      Diet Carb Modified    Complete by:  As directed      Discharge instructions    Complete by:  As directed   Do not lift more than 10 pounds Keep incision clean and dry     Discharge instructions    Complete by:  As directed   It is important that you read following instructions as well as go over your medication list with RN to help you understand your care after this hospitalization.  Discharge Instructions: Please follow-up with PCP in one week with blood sugar log Please follow up with Neurosurgery Dr Bevely Palmer in 3 weeks.  Please request your primary care physician to go over all Hospital Tests and Procedure/Radiological results at the follow up,  Please get all Hospital records sent to your PCP by signing hospital release before you go home.   Do not drive, operating heavy machinery, perform activities at heights, swimming or participation in water activities or provide baby sitting services while your are on Pain, Sleep and Anxiety Medications; until you have been seen by Primary Care Physician or a Neurologist and advised to do so again. Do not take more than prescribed Pain, Sleep and Anxiety Medications. You were cared for by a hospitalist during your hospital stay. If you have any questions about your discharge medications or the care you received while you were in the hospital after you are discharged, you can call the unit and ask to speak with the hospitalist on call if the hospitalist that took care of you is not available.  Once you are discharged, your primary care physician will handle any further medical issues. Please note that NO REFILLS for any discharge medications will be authorized once you are discharged, as it is imperative that you return to your primary care physician (or establish a relationship with a primary care physician if you do not have one) for your aftercare needs so that they can reassess your need for medications and  monitor your lab values. You Must read complete instructions/literature along with all the possible adverse reactions/side effects for all the Medicines you take and that have been prescribed to you. Take any new Medicines after you have completely understood and accept all the possible adverse reactions/side effects. Wear Seat belts while driving. If you have smoked or chewed Tobacco in the last 2 yrs please stop smoking and/or stop any Recreational drug use.  Increase activity slowly    Complete by:  As directed      Increase activity slowly    Complete by:  As directed      Lifting restrictions    Complete by:  As directed   Do not lift more than ten pounds     No dressing needed    Complete by:  As directed           Discharge Exam: Filed Weights   07/28/15 2100  Weight: 72.757 kg (160 lb 6.4 oz)   Filed Vitals:   07/30/15 0929 07/30/15 1333  BP: 105/71 101/59  Pulse: 90 84  Temp: 99.1 F (37.3 C) 99.4 F (37.4 C)  Resp: 18 18   General: Appear in no distress, no Rash; Oral Mucosa moist. Cardiovascular: S1 and S2 Present, no Murmur, no JVD Respiratory: Bilateral Air entry present and Clear to Auscultation, no Crackles, no wheezes Abdomen: Bowel Sound present, Soft and no tenderness Extremities: no Pedal edema, no calf tenderness Neurology: Grossly no focal neuro deficit.  The results of significant diagnostics from this hospitalization (including imaging, microbiology, ancillary and laboratory) are listed below for reference.    Significant Diagnostic Studies: Dg Chest 2 View  07/28/2015  CLINICAL DATA:  Pre-op for lumbar micro discectomy. EXAM: CHEST  2 VIEW COMPARISON:  Limited correlation made with abdominal CT 07/28/2015. FINDINGS: The heart size and mediastinal contours are normal. The lungs are clear. There is mild central airway thickening. There is no pleural effusion or pneumothorax. No acute osseous findings are identified. IMPRESSION: Mild central airway  thickening attributed to smoking. No acute cardiopulmonary process. Electronically Signed   By: Carey Bullocks M.D.   On: 07/28/2015 19:54   Mr Lumbar Spine Wo Contrast  07/28/2015  CLINICAL DATA:  Low back and groin pain. Decreased ability to ambulate. EXAM: MRI LUMBAR SPINE WITHOUT CONTRAST TECHNIQUE: Multiplanar, multisequence MR imaging of the lumbar spine was performed. No intravenous contrast was administered. COMPARISON:  CT abdomen and pelvis 07/28/2015 FINDINGS: There are bilateral L5 pars defects with 11 mm anterolisthesis of L5 on S1. There is severe disc space height loss at L5-S1 with osseous fusion across the disc space. Slight retrolisthesis is noted of L2 on L3 and L3 on L4. Severe disc space narrowing is present at L2-3 with prominent degenerative endplate marrow changes including moderate edema. There is mild disc space narrowing at L3-4. Vertebral body heights are preserved. The conus medullaris is normal in signal and terminates at T12. There is mild nonspecific paraspinal muscular edema bilaterally in the lower lumbar spine. L1-2:  Negative. L2-3: Circumferential disc bulging with superimposed, large left central disc extrusion which demonstrates caudal migration in the left lateral recess to the mid L3 vertebral body level. Along with mild facet and ligamentum flavum hypertrophy, the disc bulging and extrusion result in severe spinal stenosis, left greater than right lateral recess stenosis, and mild left neural foraminal narrowing. L3-4: Mild disc bulging, small superimposed right paracentral/subarticular disc extrusion with minimal caudal migration, and mild facet and ligamentum flavum hypertrophy result in moderate spinal stenosis, moderate right and mild left lateral recess stenosis, and mild right neural foraminal stenosis. L4-5: Minimal leftward disc bulging and mild-to-moderate facet hypertrophy result in minimal left neural foraminal narrowing. No significant spinal stenosis. L5-S1:  Listhesis with disc uncovering and disc space height loss result in severe left greater than right neural foraminal stenosis without significant spinal stenosis. IMPRESSION: 1. Large L2-3 disc extrusion with severe spinal stenosis. 2. Moderate spinal  stenosis and right greater than left lateral recess stenosis at L3-4. 3. Grade 2 anterolisthesis of L5 on S1 with severe bilateral foraminal stenosis. Electronically Signed   By: Sebastian AcheAllen  Grady M.D.   On: 07/28/2015 17:35   Ct Abdomen Pelvis W Contrast  07/28/2015  CLINICAL DATA:  Left lower quadrant and low back pain, chronic. No known injury. Initial encounter. EXAM: CT ABDOMEN AND PELVIS WITH CONTRAST TECHNIQUE: Multidetector CT imaging of the abdomen and pelvis was performed using the standard protocol following bolus administration of intravenous contrast. CONTRAST:  100 ISOVUE-300 IOPAMIDOL (ISOVUE-300) INJECTION 61% COMPARISON:  None. FINDINGS: The lung bases are clear. No pleural or pericardial effusion. Calcific coronary artery disease is identified. A single tiny stone is seen in the neck of the gallbladder. There is no gallbladder wall thickening or surrounding inflammatory change. The liver, adrenal glands, pancreas and spleen appear normal. Tiny hypoattenuating lesion the mid pole of the right kidney is likely a cyst. Mild scarring is seen in the mid and lower pole of the left kidney. Extensive aortoiliac atherosclerosis without aneurysm is identified. The stomach, small and large bowel and appendix appear normal. No lymphadenopathy or fluid is seen. The L5-S1 level is fused with 1 cm anterolisthesis identified. There is marked degenerative disc disease at L3-4 where there is loss of disc space height and endplate spurring. A cystic appearing structure off the medial and inferior aspect of the left facet joint measures 1.2 cm craniocaudal by 1.2 cm transverse by 1.1 cm AP and has partial rim calcification. It causes marked central canal and left lateral  recess narrowing. IMPRESSION: No acute abnormality abdomen or pelvis. Lumbar spondylosis appearing worst at L3-4 there is a large synovial cyst or calcified disc fragment just below the disc interspace in the left lateral recess causing marked central canal stenosis and left lateral recess narrowing. If the patient has radicular symptoms, MRI of the lumbar spine could be used further evaluation. Extensive calcific coronary and aortic atherosclerosis. Single small gallstone without evidence of cholecystitis. Electronically Signed   By: Drusilla Kannerhomas  Dalessio M.D.   On: 07/28/2015 13:42   Dg Lumbar Spine 1 View  07/29/2015  CLINICAL DATA:  Portable imaging for L2-L3 microdiskectomy. 7 seconds fluoro time. EXAM: DG C-ARM 61-120 MIN; LUMBAR SPINE - 1 VIEW COMPARISON:  Lumbar MRI, 07/28/2015 FINDINGS: Single portable lateral view shows the surgical microscope positioned posterior to the lower posterior margin of the L3 vertebra, superimposed on the L3 spinous process. IMPRESSION: Surgical localization imaging for lumbar microdiskectomy as detailed. Electronically Signed   By: Amie Portlandavid  Ormond M.D.   On: 07/29/2015 15:06   Dg Knee Complete 4 Views Left  07/28/2015  CLINICAL DATA:  Chronic left hip pain EXAM: LEFT KNEE - COMPLETE 4+ VIEW COMPARISON:  None. FINDINGS: The left knee is located. No acute bone or soft tissue abnormality is present. Diffuse atherosclerotic calcifications are present. IMPRESSION: 1. No acute abnormality of the left knee. 2. Atherosclerosis. Electronically Signed   By: Marin Robertshristopher  Mattern M.D.   On: 07/28/2015 13:41   Dg C-arm 1-60 Min  07/29/2015  CLINICAL DATA:  Portable imaging for L2-L3 microdiskectomy. 7 seconds fluoro time. EXAM: DG C-ARM 61-120 MIN; LUMBAR SPINE - 1 VIEW COMPARISON:  Lumbar MRI, 07/28/2015 FINDINGS: Single portable lateral view shows the surgical microscope positioned posterior to the lower posterior margin of the L3 vertebra, superimposed on the L3 spinous process.  IMPRESSION: Surgical localization imaging for lumbar microdiskectomy as detailed. Electronically Signed   By: Renard Hamperavid  Ormond M.D.  On: 07/29/2015 15:06   Dg Hip Unilat With Pelvis 2-3 Views Left  07/28/2015  CLINICAL DATA:  Chronic left hip and knee pain over the last 5 months. EXAM: DG HIP (WITH OR WITHOUT PELVIS) 2-3V LEFT COMPARISON:  Left hip radiographs 05/11/2015. FINDINGS: Mild degenerative changes are present in the lower lumbar spine. Atherosclerotic calcifications are seen bilaterally. Spells is intact. The left hip is located. No acute bone or soft tissue abnormality is present. IMPRESSION: 1. No acute abnormality or significant interval change. 2. Atherosclerosis. Electronically Signed   By: Marin Roberts M.D.   On: 07/28/2015 13:40    Microbiology: Recent Results (from the past 240 hour(s))  MRSA PCR Screening     Status: None   Collection Time: 07/28/15  9:40 PM  Result Value Ref Range Status   MRSA by PCR NEGATIVE NEGATIVE Final    Comment:        The GeneXpert MRSA Assay (FDA approved for NASAL specimens only), is one component of a comprehensive MRSA colonization surveillance program. It is not intended to diagnose MRSA infection nor to guide or monitor treatment for MRSA infections.      Labs: CBC:  Recent Labs Lab 07/28/15 1114 07/29/15 0418  WBC 9.6 7.8  NEUTROABS 6.1 4.6  HGB 15.3 14.1  HCT 44.4 41.1  MCV 92.3 93.4  PLT 214 189   Basic Metabolic Panel:  Recent Labs Lab 07/28/15 1114 07/29/15 0418  NA 133* 135  K 4.5 4.2  CL 94* 98*  CO2 27 25  GLUCOSE 221* 146*  BUN 11 12  CREATININE 1.42* 1.39*  CALCIUM 10.0 9.2   Liver Function Tests:  Recent Labs Lab 07/28/15 1114 07/29/15 0418  AST 31 27  ALT 38 33  ALKPHOS 63 52  BILITOT 1.4* 0.9  PROT 7.8 6.6  ALBUMIN 3.8 3.3*   CBG:  Recent Labs Lab 07/29/15 2000 07/30/15 0004 07/30/15 0410 07/30/15 0716 07/30/15 1109  GLUCAP 190* 100* 212* 160* 207*   Time spent: 30  minutes  Signed:  Gracynn Rajewski  [facility] 07/30/2015 , 7:44 AM

## 2015-08-06 ENCOUNTER — Encounter: Payer: Self-pay | Admitting: Internal Medicine

## 2015-08-06 ENCOUNTER — Ambulatory Visit (INDEPENDENT_AMBULATORY_CARE_PROVIDER_SITE_OTHER): Payer: Medicare Other | Admitting: Internal Medicine

## 2015-08-06 VITALS — BP 110/60 | HR 86 | Temp 97.5°F | Wt 165.0 lb

## 2015-08-06 DIAGNOSIS — IMO0001 Reserved for inherently not codable concepts without codable children: Secondary | ICD-10-CM

## 2015-08-06 DIAGNOSIS — E785 Hyperlipidemia, unspecified: Secondary | ICD-10-CM | POA: Diagnosis not present

## 2015-08-06 DIAGNOSIS — Z4789 Encounter for other orthopedic aftercare: Secondary | ICD-10-CM | POA: Diagnosis not present

## 2015-08-06 DIAGNOSIS — M5127 Other intervertebral disc displacement, lumbosacral region: Secondary | ICD-10-CM

## 2015-08-06 DIAGNOSIS — I129 Hypertensive chronic kidney disease with stage 1 through stage 4 chronic kidney disease, or unspecified chronic kidney disease: Secondary | ICD-10-CM | POA: Diagnosis not present

## 2015-08-06 DIAGNOSIS — I700 Atherosclerosis of aorta: Secondary | ICD-10-CM

## 2015-08-06 DIAGNOSIS — N189 Chronic kidney disease, unspecified: Secondary | ICD-10-CM | POA: Diagnosis not present

## 2015-08-06 DIAGNOSIS — Z794 Long term (current) use of insulin: Secondary | ICD-10-CM | POA: Diagnosis not present

## 2015-08-06 DIAGNOSIS — E1122 Type 2 diabetes mellitus with diabetic chronic kidney disease: Secondary | ICD-10-CM | POA: Diagnosis not present

## 2015-08-06 DIAGNOSIS — J449 Chronic obstructive pulmonary disease, unspecified: Secondary | ICD-10-CM

## 2015-08-06 DIAGNOSIS — K219 Gastro-esophageal reflux disease without esophagitis: Secondary | ICD-10-CM | POA: Diagnosis not present

## 2015-08-06 DIAGNOSIS — E1140 Type 2 diabetes mellitus with diabetic neuropathy, unspecified: Secondary | ICD-10-CM

## 2015-08-06 DIAGNOSIS — Z7984 Long term (current) use of oral hypoglycemic drugs: Secondary | ICD-10-CM | POA: Diagnosis not present

## 2015-08-06 DIAGNOSIS — I350 Nonrheumatic aortic (valve) stenosis: Secondary | ICD-10-CM | POA: Insufficient documentation

## 2015-08-06 DIAGNOSIS — I2510 Atherosclerotic heart disease of native coronary artery without angina pectoris: Secondary | ICD-10-CM

## 2015-08-06 LAB — HM DIABETES FOOT EXAM

## 2015-08-06 NOTE — Assessment & Plan Note (Signed)
Has been controlled but high again off the metformin Will restart Mild neuro symptoms---just some pins and needles in plantar feet

## 2015-08-06 NOTE — Assessment & Plan Note (Signed)
Unwilling to stop smoking Doing okay from respiratory standpoint

## 2015-08-06 NOTE — Progress Notes (Signed)
Pre visit review using our clinic review tool, if applicable. No additional management support is needed unless otherwise documented below in the visit note. 

## 2015-08-06 NOTE — Assessment & Plan Note (Signed)
Doing well without angina Will restart ACEI despite good BP

## 2015-08-06 NOTE — Assessment & Plan Note (Signed)
Loud aortic murmur Echo 2011 looked okay No symptoms of stenosis--will hold off on repeat for now

## 2015-08-06 NOTE — Progress Notes (Signed)
Subjective:    Patient ID: Howard Dixon, male    DOB: 04-01-1945, 70 y.o.   MRN: [id number]  HPI Here for hospital follow up  No visit here for years --getting care at [facility] Wants to come back here due to concerns there Had back pain for months-- no action from [place]. Had MRI about 2 weeks ago but never heard back Finally pain so bad---so went to ER Admitted---had microdiskectomy and now his pain is much better Now only using muscle relaxer sparingly  Diabetes has not been well controlled Taken off metformin and lantus a month ago--A1c was good Now occasionally over 200--usually 160-180 fasting Had been 80-120 Rare low sugar reactions--but not common Found that stopping the lantus on his own didn't make much difference  (was on 22 units)  No chest pain No SOB Only occasional cough  Lisinopril  was also stopped BP was running low No dizziness though  Current Outpatient Prescriptions on File Prior to Visit  Medication Sig Dispense Refill  . albuterol (PROVENTIL HFA;VENTOLIN HFA) 108 (90 BASE) MCG/ACT inhaler Inhale 2 puffs into the lungs 4 (four) times daily as needed.    Marland Kitchen aspirin 81 MG tablet Take 81 mg by mouth daily.    Marland Kitchen atorvastatin (LIPITOR) 10 MG tablet Take 10 mg by mouth daily at 6 PM. NOT SURE OF DOSE    . BD ULTRA-FINE LANCETS lancets 1 each by Other route. Use as instructed    . Cholecalciferol (VITAMIN D-3) 1000 UNITS CAPS Take 1 capsule by mouth daily.    Marland Kitchen docusate sodium (COLACE) 100 MG capsule Take 1 capsule (100 mg total) by mouth daily as needed for mild constipation. 10 capsule 0  . flunisolide (NASAREL) 29 MCG/ACT (0.025%) nasal spray Place 2 sprays into the nose 2 (two) times daily. Dose is for each nostril.    Marland Kitchen glipiZIDE (GLUCOTROL) 10 MG tablet Take 10 mg by mouth 2 (two) times daily before a meal.    . glucose blood (PRECISION XTRA TEST STRIPS) test strip 1 each by Other route as needed. Use as instructed    . hydrocerin (EUCERIN) CREA Apply 1  application topically 2 (two) times daily.    . Insulin Syringe-Needle U-100 (INSULIN SYRINGE .5CC/31GX5/16") 31G X 5/16" 0.5 ML MISC by Does not apply route.    . loratadine (CLARITIN) 10 MG tablet Take 10 mg by mouth daily.    . methocarbamol (ROBAXIN) 500 MG tablet Take 1 tablet (500 mg total) by mouth every 8 (eight) hours as needed for muscle spasms. 21 tablet 0  . metoprolol (LOPRESSOR) 50 MG tablet Take 50 mg by mouth 2 (two) times daily.     . nicotine (NICODERM CQ - DOSED IN MG/24 HOURS) 21 mg/24hr patch Place 1 patch (21 mg total) onto the skin daily. 28 patch 0  . polyethylene glycol (MIRALAX / GLYCOLAX) packet Take 17 g by mouth daily. 14 each 0  . ranitidine (ZANTAC) 150 MG capsule Take 150 mg by mouth 2 (two) times daily.     No current facility-administered medications on file prior to visit.    Allergies  Allergen Reactions  . Sulfa Antibiotics Rash    Past Medical History  Diagnosis Date  . Allergic rhinitis due to pollen   . Coronary artery disease, occlusive   . Hyperlipidemia   . Hypertension   . Type II or unspecified type diabetes mellitus with neurological manifestations, not stated as uncontrolled   . GERD (gastroesophageal reflux disease)   .  COPD (chronic obstructive pulmonary disease) [facility](HCC)     Past Surgical History  Procedure Laterality Date  . Hernia repair    . Back surgery    . Lumbar laminectomy/decompression microdiscectomy Left 07/29/2015    Procedure: Left Lumbar two-three microdiscectomy;  Surgeon: Loura HaltBenjamin Jared Ditty, MD;  Location: [facility];  Service: Neurosurgery;  Laterality: Left;    Family History  Problem Relation Age of Onset  . Diabetes Mother   . Hypertension Mother   . Diabetes Father   . Heart disease Father   . Hypertension Father   . Diabetes Sister   . Hypertension Sister   . Diabetes Brother   . Heart disease Brother   . Hypertension Brother   . Cancer Neg Hx   . Diabetes Brother   . Hypertension Brother   .  Diabetes Brother   . Heart disease Brother   . Kidney disease Brother   . Hypertension Brother     Social History   Social History  . Marital Status: Married    Spouse Name: N/A  . Number of Children: 3  . Years of Education: N/A   Occupational History  . Truck driver--retired due to medical problems    Social History Main Topics  . Smoking status: Current Every Day Smoker -- 1.00 packs/day    Types: Cigarettes    Start date: 03/22/1962  . Smokeless tobacco: Never Used     Comment: Just not willing to stop  . Alcohol Use: No  . Drug Use: No  . Sexual Activity: Not on file   Other Topics Concern  . Not on file   Social History Narrative   1 son--in prison for a long time      No living will   Not sure about health care POA---having some marital issues   Review of Systems Appetite better now that pain is better Sleeping okay    Objective:   Physical Exam  Constitutional: He appears well-developed and well-nourished. No distress.  Neck: Normal range of motion. Neck supple. No thyromegaly present.  Cardiovascular: Normal rate, regular rhythm and intact distal pulses.  Exam reveals no gallop.   Loud aortic systolic murmur  Musculoskeletal: He exhibits no edema.  Lymphadenopathy:    He has no cervical adenopathy.  Neurological:  Normal sensation in chest  Skin: No rash noted. No erythema.  No foot lesions  Psychiatric: He has a normal mood and affect. His behavior is normal.          Assessment & Plan:

## 2015-08-06 NOTE — Assessment & Plan Note (Signed)
Much better after procedure.

## 2015-08-06 NOTE — Patient Instructions (Signed)
Restart the metformin and lisinopril.

## 2015-08-11 ENCOUNTER — Other Ambulatory Visit: Payer: Self-pay

## 2015-08-11 MED ORDER — LISINOPRIL 10 MG PO TABS: 10.0000 mg | ORAL_TABLET | Freq: Every day | ORAL | Status: AC

## 2015-08-11 MED ORDER — METFORMIN HCL 1000 MG PO TABS: 1000.0000 mg | ORAL_TABLET | Freq: Two times a day (BID) | ORAL | Status: AC

## 2015-08-11 NOTE — Telephone Encounter (Signed)
Refills were printed and faxed to the number provided. Tried to call pt. He was not home and his wife was not either

## 2015-08-11 NOTE — Telephone Encounter (Signed)
Pt was seen 08/06/15 and pt was restarted on lisinopril and metformin; pt request [facility]ax # [phone]. Pt request cb when refilled.

## 2015-12-06 ENCOUNTER — Encounter (HOSPITAL_COMMUNITY): Payer: Self-pay

## 2015-12-06 ENCOUNTER — Emergency Department (HOSPITAL_COMMUNITY)
Admission: EM | Admit: 2015-12-06 | Discharge: 2015-12-06 | Disposition: A | Discharge status: Home / Self Care | Payer: Medicare Other | Attending: Emergency Medicine | Admitting: Emergency Medicine

## 2015-12-06 DIAGNOSIS — I10 Essential (primary) hypertension: Secondary | ICD-10-CM | POA: Insufficient documentation

## 2015-12-06 DIAGNOSIS — F17210 Nicotine dependence, cigarettes, uncomplicated: Secondary | ICD-10-CM | POA: Insufficient documentation

## 2015-12-06 DIAGNOSIS — Z79899 Other long term (current) drug therapy: Secondary | ICD-10-CM | POA: Insufficient documentation

## 2015-12-06 DIAGNOSIS — I2510 Atherosclerotic heart disease of native coronary artery without angina pectoris: Secondary | ICD-10-CM | POA: Diagnosis not present

## 2015-12-06 DIAGNOSIS — Z794 Long term (current) use of insulin: Secondary | ICD-10-CM | POA: Diagnosis not present

## 2015-12-06 DIAGNOSIS — Z5321 Procedure and treatment not carried out due to patient leaving prior to being seen by health care provider: Secondary | ICD-10-CM | POA: Insufficient documentation

## 2015-12-06 DIAGNOSIS — J449 Chronic obstructive pulmonary disease, unspecified: Secondary | ICD-10-CM | POA: Insufficient documentation

## 2015-12-06 DIAGNOSIS — Z7982 Long term (current) use of aspirin: Secondary | ICD-10-CM | POA: Diagnosis not present

## 2015-12-06 DIAGNOSIS — E119 Type 2 diabetes mellitus without complications: Secondary | ICD-10-CM | POA: Diagnosis not present

## 2015-12-06 DIAGNOSIS — Z7984 Long term (current) use of oral hypoglycemic drugs: Secondary | ICD-10-CM | POA: Insufficient documentation

## 2015-12-06 NOTE — ED Triage Notes (Signed)
I checked my blood pressure at home and it was 99/55.  This was around 0100 this morning.  I was up on the computer and thought I would check it and see what it was.  I talked to a nurse at the [place] and she said I should come here and have my blood pressure checked.  No other complaints. No pain.  Here for a BP check.

## 2015-12-06 NOTE — ED Notes (Signed)
Patient signed out, stating that he came to have his blood pressure checked and it was fine.  Does not want to be seen by the MD.  States that he is going home.

## 2015-12-06 NOTE — ED Provider Notes (Signed)
Pt not in room at 03:00AM   Devoria AlbeIva Anaissa Macfadden, MD 12/06/15 [phone]

## 2015-12-06 NOTE — ED Triage Notes (Signed)
117/60 the second time I checked it and last time I checked it it was 99/56

## 2015-12-09 ENCOUNTER — Ambulatory Visit (INDEPENDENT_AMBULATORY_CARE_PROVIDER_SITE_OTHER): Payer: Medicare Other

## 2015-12-09 ENCOUNTER — Other Ambulatory Visit: Payer: Self-pay | Admitting: Internal Medicine

## 2015-12-09 VITALS — BP 110/62 | HR 73 | Temp 97.9°F | Ht 64.5 in | Wt 172.5 lb

## 2015-12-09 DIAGNOSIS — Z0000 Encounter for general adult medical examination without abnormal findings: Secondary | ICD-10-CM

## 2015-12-09 MED ORDER — ALBUTEROL SULFATE HFA 108 (90 BASE) MCG/ACT IN AERS: 2.0000 | INHALATION_SPRAY | Freq: Four times a day (QID) | RESPIRATORY_TRACT | 4 refills | Status: AC | PRN

## 2015-12-09 NOTE — Telephone Encounter (Signed)
[facility]

## 2015-12-09 NOTE — Telephone Encounter (Signed)
[facility] [place] - fax umber is [phone] Also, include pt's last 4 odf ss number ([phone]) and dob with fax  Thanks

## 2015-12-09 NOTE — Patient Instructions (Signed)
Mr. Howard Dixon , Thank you for taking time to come for your Medicare Wellness Visit. I appreciate your ongoing commitment to your health goals. Please review the following plan we discussed and let me know if I can assist you in the future.   These are the goals we discussed: Goals    . Increase physical activity          Starting 12/09/2015, I will continue to walk at least one mile twice weekly.        This is a list of the screening recommended for you and due dates:  Health Maintenance  Topic Date Due  . Flu Shot  03/21/2016*  . Colon Cancer Screening  12/08/2016*  . Pneumonia vaccines (1 of 2 - PCV13) 12/08/2016*  . Stool Blood Test  03/30/2016  . Hemoglobin A1C  04/07/2016  . Eye exam for diabetics  07/15/2016  . Complete foot exam   08/05/2016  . Tetanus Vaccine  12/17/2021  . Shingles Vaccine  Completed  .  Hepatitis C: One time screening is recommended by Center for Disease Control  (CDC) for  adults born from [birth year] through 1965.   Addressed  *Topic was postponed. The date shown is not the original due date.   Preventive Care for Adults  A healthy lifestyle and preventive care can promote health and wellness. Preventive health guidelines for adults include the following key practices.  . A routine yearly physical is a good way to check with your health care provider about your health and preventive screening. It is a chance to share any concerns and updates on your health and to receive a thorough exam.  . Visit your dentist for a routine exam and preventive care every 6 months. Brush your teeth twice a day and floss once a day. Good oral hygiene prevents tooth decay and gum disease.  . The frequency of eye exams is based on your age, health, family medical history, use  of contact lenses, and other factors. Follow your health care provider's ecommendations for frequency of eye exams.  . Eat a healthy diet. Foods like vegetables, fruits, whole grains, low-fat dairy products,  and lean protein foods contain the nutrients you need without too many calories. Decrease your intake of foods high in solid fats, added sugars, and salt. Eat the right amount of calories for you. Get information about a proper diet from your health care provider, if necessary.  . Regular physical exercise is one of the most important things you can do for your health. Most adults should get at least 150 minutes of moderate-intensity exercise (any activity that increases your heart rate and causes you to sweat) each week. In addition, most adults need muscle-strengthening exercises on 2 or more days a week.  Silver Sneakers may be a benefit available to you. To determine eligibility, you may visit the website: www.silversneakers.com or contact program at [phone] Mon-Fri between 8AM-8PM.   . Maintain a healthy weight. The body mass index (BMI) is a screening tool to identify possible weight problems. It provides an estimate of body fat based on height and weight. Your health care provider can find your BMI and can help you achieve or maintain a healthy weight.   For adults 20 years and older: ? A BMI below 18.5 is considered underweight. ? A BMI of 18.5 to 24.9 is normal. ? A BMI of 25 to 29.9 is considered overweight. ? A BMI of 30 and above is considered obese.   . Maintain  normal blood lipids and cholesterol levels by exercising and minimizing your intake of saturated fat. Eat a balanced diet with plenty of fruit and vegetables. Blood tests for lipids and cholesterol should begin at age 86 and be repeated every 5 years. If your lipid or cholesterol levels are high, you are over 50, or you are at high risk for heart disease, you may need your cholesterol levels checked more frequently. Ongoing high lipid and cholesterol levels should be treated with medicines if diet and exercise are not working.  . If you smoke, find out from your health care provider how to quit. If you do not use tobacco,  please do not start.  . If you choose to drink alcohol, please do not consume more than 2 drinks per day. One drink is considered to be 12 ounces (355 mL) of beer, 5 ounces (148 mL) of wine, or 1.5 ounces (44 mL) of liquor.  . If you are 22-52 years old, ask your health care provider if you should take aspirin to prevent strokes.  . Use sunscreen. Apply sunscreen liberally and repeatedly throughout the day. You should seek shade when your shadow is shorter than you. Protect yourself by wearing long sleeves, pants, a wide-brimmed hat, and sunglasses year round, whenever you are outdoors.  . Once a month, do a whole body skin exam, using a mirror to look at the skin on your back. Tell your health care provider of new moles, moles that have irregular borders, moles that are larger than a pencil eraser, or moles that have changed in shape or color.

## 2015-12-09 NOTE — Progress Notes (Signed)
   Subjective:    Patient ID: Howard Dixon, male    DOB: 02-19-1946, 70 y.o.   MRN: [id number]  HPI  I reviewed health advisor's note, was available for consultation, and agree with documentation and plan.   Review of Systems     Objective:   Physical Exam        Assessment & Plan:

## 2015-12-09 NOTE — Telephone Encounter (Signed)
Pt returned your call [facility] normally gives him a 90day  Pt stated he was completely out of abutrol

## 2015-12-09 NOTE — Progress Notes (Signed)
Pre visit review using our clinic review tool, if applicable. No additional management support is needed unless otherwise documented below in the visit note. 

## 2015-12-09 NOTE — Telephone Encounter (Signed)
Left a message for pt. Does he need a 30 day or 90 day supply?

## 2015-12-09 NOTE — Progress Notes (Signed)
PCP notes:   Health maintenance:  Colonoscopy - declined PNA vaccines - [facility] Flu vaccine- addressed A1C [facility] Hep C screening - [facility] Eye exam - [facility]  Abnormal screenings:   Hearing - failed  Patient concerns:   Pt is having marital and family issues that he say is impacting his health. Pt needs refill for albuterol inhaler. Pt was asked to contact PCP with [facility] so that he can get a new inhaler.   Nurse concerns:  None  Next PCP appt:   03/10/16 @ 1130

## 2015-12-09 NOTE — Progress Notes (Signed)
Subjective:   Howard Dixon is a 70 y.o. male who presents for an Initial Medicare Annual Wellness Visit.  Review of Systems   Cardiac Risk Factors include: advanced age (>75men, >44 women);diabetes mellitus;dyslipidemia;male gender;hypertension;smoking/ tobacco exposure    Objective:    Today's Vitals   12/09/15 0825  BP: 110/62  Pulse: 73  Temp: 97.9 F (36.6 C)  TempSrc: Oral  SpO2: 96%  Weight: 172 lb 8 oz (78.2 kg)  Height: 5' 4.5" (1.638 m)  PainSc: 0-No pain   Body mass index is 29.15 kg/m.  Current Medications (verified) Outpatient Encounter Prescriptions as of 12/09/2015  Medication Sig  . aspirin 81 MG tablet Take 81 mg by mouth daily.  Marland Kitchen atorvastatin (LIPITOR) 10 MG tablet Take 10 mg by mouth daily at 6 PM. NOT SURE OF DOSE  . BD ULTRA-FINE LANCETS lancets 1 each by Other route. Use as instructed  . Cholecalciferol (VITAMIN D-3) 1000 UNITS CAPS Take 1 capsule by mouth daily.  Marland Kitchen docusate sodium (COLACE) 100 MG capsule Take 1 capsule (100 mg total) by mouth daily as needed for mild constipation.  . flunisolide (NASAREL) 29 MCG/ACT (0.025%) nasal spray Place 2 sprays into the nose 2 (two) times daily. Dose is for each nostril.  Marland Kitchen glipiZIDE (GLUCOTROL) 10 MG tablet Take 10 mg by mouth 2 (two) times daily before a meal.  . glucose blood (PRECISION XTRA TEST STRIPS) test strip 1 each by Other route as needed. Use as instructed  . hydrocerin (EUCERIN) CREA Apply 1 application topically 2 (two) times daily.  . Insulin Syringe-Needle U-100 (INSULIN SYRINGE .5CC/31GX5/16") 31G X 5/16" 0.5 ML MISC by Does not apply route.  Marland Kitchen lisinopril (PRINIVIL,ZESTRIL) 10 MG tablet Take 1 tablet (10 mg total) by mouth daily.  Marland Kitchen loratadine (CLARITIN) 10 MG tablet Take 10 mg by mouth daily.  . metFORMIN (GLUCOPHAGE) 1000 MG tablet Take 1 tablet (1,000 mg total) by mouth 2 (two) times daily with a meal.  . methocarbamol (ROBAXIN) 500 MG tablet Take 1 tablet (500 mg total) by mouth every 8  (eight) hours as needed for muscle spasms.  . metoprolol (LOPRESSOR) 50 MG tablet Take 50 mg by mouth 2 (two) times daily.   . nicotine (NICODERM CQ - DOSED IN MG/24 HOURS) 21 mg/24hr patch Place 1 patch (21 mg total) onto the skin daily.  . polyethylene glycol (MIRALAX / GLYCOLAX) packet Take 17 g by mouth daily.  . ranitidine (ZANTAC) 150 MG capsule Take 150 mg by mouth 2 (two) times daily.  Marland Kitchen albuterol (PROVENTIL HFA;VENTOLIN HFA) 108 (90 BASE) MCG/ACT inhaler Inhale 2 puffs into the lungs 4 (four) times daily as needed.   No facility-administered encounter medications on file as of 12/09/2015.     Allergies (verified) Sulfa antibiotics   History: Past Medical History:  Diagnosis Date  . Allergic rhinitis due to pollen   . COPD (chronic obstructive pulmonary disease) (HCC)   . Coronary artery disease, occlusive   . GERD (gastroesophageal reflux disease)   . Hyperlipidemia   . Hypertension   . Type II or unspecified type diabetes mellitus with neurological manifestations, not stated as uncontrolled    Past Surgical History:  Procedure Laterality Date  . BACK SURGERY    . HERNIA REPAIR    . LUMBAR LAMINECTOMY/DECOMPRESSION MICRODISCECTOMY Left 07/29/2015   Procedure: Left Lumbar two-three microdiscectomy;  Surgeon: Loura Halt Ditty, MD;  Location: [facility];  Service: Neurosurgery;  Laterality: Left;   Family History  Problem Relation Age  of Onset  . Diabetes Mother   . Hypertension Mother   . Diabetes Father   . Heart disease Father   . Hypertension Father   . Diabetes Sister   . Hypertension Sister   . Diabetes Brother   . Heart disease Brother   . Hypertension Brother   . Diabetes Brother   . Hypertension Brother   . Diabetes Brother   . Heart disease Brother   . Kidney disease Brother   . Hypertension Brother   . Cancer Neg Hx    Social History   Occupational History  . Truck driver--retired due to medical problems    Social History Main Topics  .  Smoking status: Current Every Day Smoker    Packs/day: 1.00    Types: Cigarettes    Start date: 03/22/1962  . Smokeless tobacco: Never Used     Comment: Just not willing to stop  . Alcohol use No  . Drug use: No  . Sexual activity: No   Tobacco Counseling Ready to quit: No Counseling given: No   Activities of Daily Living In your present state of health, do you have any difficulty performing the following activities: 12/09/2015 07/28/2015  Hearing? Y N  Vision? N N  Difficulty concentrating or making decisions? N N  Walking or climbing stairs? N Y  Dressing or bathing? N N  Doing errands, shopping? N N  Preparing Food and eating ? N -  Using the Toilet? N -  In the past six months, have you accidently leaked urine? N -  Do you have problems with loss of bowel control? N -  Managing your Medications? N -  Managing your Finances? N -  Housekeeping or managing your Housekeeping? N -  Some recent data might be hidden    Immunizations and Health Maintenance Immunization History  Administered Date(s) Administered  . Influenza Split 12/18/2011  . Pneumococcal Polysaccharide-23 12/18/2011  . Td 12/18/2011  . Zoster 05/21/2011   There are no preventive care reminders to display for this patient.  Patient Care Team: Karie Schwalbe, MD as PCP - General (Internal Medicine)  Indicate any recent Medical Services you may have received from other than [facility] providers in the past year (date may be approximate).    Assessment:   This is a routine wellness examination for Howard Dixon.   Hearing/Vision screen  Hearing Screening   125Hz  250Hz  500Hz  1000Hz  2000Hz  3000Hz  4000Hz  6000Hz  8000Hz   Right ear:   0 0 40  40    Left ear:   40 40 40  0    Vision Screening Comments: Last vision exam in April 2017 at [facility]  Dietary issues and exercise activities discussed: Current Exercise Habits: Home exercise routine, Type of exercise: walking, Time (Minutes): 20, Frequency (Times/Week): 2, Weekly  Exercise (Minutes/Week): 40, Intensity: Mild, Exercise limited by: None identified  Goals    . Increase physical activity          Starting 12/09/2015, I will continue to walk at least one mile twice weekly.       Depression Screen PHQ 2/9 Scores 12/09/2015 01/16/2013  PHQ - 2 Score 0 1    Fall Risk Fall Risk  12/09/2015 01/16/2013  Falls in the past year? No No    Cognitive Function: No flowsheet data found.  Screening Tests Health Maintenance  Topic Date Due  . INFLUENZA VACCINE  03/21/2016 (Originally 10/21/2015)  . COLONOSCOPY  12/08/2016 (Originally 12/18/1996)  . PNA vac Low Risk Adult (1  of 2 - PCV13) 12/08/2016 (Originally 12/17/2012)  . COLON CANCER SCREENING ANNUAL FOBT  03/30/2016  . HEMOGLOBIN A1C  04/07/2016  . OPHTHALMOLOGY EXAM  07/15/2016  . FOOT EXAM  08/05/2016  . TETANUS/TDAP  12/17/2021  . ZOSTAVAX  Completed  . Hepatitis C Screening  Addressed        Plan:      I have personally reviewed and addressed the Medicare Annual Wellness questionnaire and have noted the following in the patient's chart:  A. Medical and social history B. Use of alcohol, tobacco or illicit drugs  C. Current medications and supplements D. Functional ability and status E.  Nutritional status F.  Physical activity G. Advance directives H. List of other physicians I.  Hospitalizations, surgeries, and ER visits in previous 12 months J.  Vitals K. Screenings to include hearing, vision, cognitive, depression L. Referrals and appointments - none  In addition, I have reviewed and discussed with patient certain preventive protocols, quality metrics, and best practice recommendations. A written personalized care plan for preventive services as well as general preventive health recommendations were provided to patient.  See attached scanned questionnaire for additional information.   Signed,   Randa EvensLesia Kj Imbert, MHA, BS, LPN Health Advisor

## 2016-02-17 ENCOUNTER — Telehealth: Payer: Self-pay | Admitting: Internal Medicine

## 2016-02-17 NOTE — Telephone Encounter (Signed)
Patient received a call about his flu shot.  Patient said he hasn't received his flu shot.  [facility] [place] and he'll get [facility]

## 2016-02-17 NOTE — Telephone Encounter (Signed)
Advised pt to let us know when he gets it so I can document it.

## 2016-02-25 ENCOUNTER — Telehealth: Payer: Self-pay

## 2016-02-25 NOTE — Telephone Encounter (Signed)
Pt left v/m; received med [facility] [place] and concerned about possible side effects. Pt has been taking furosemide for one month.Pt is concerned about furosemide causing pt to be SOB and constipated; pt wants to know what Dr Alphonsus SiasLetvak would suggest pt take instead of furosemide. Pt request cb. [facility] [place]. Pt is not feeling in any distress and does not need to go to ED. Pt said cb on 02/26/16. Pt said has had these feelings since started taking med a month ago.

## 2016-02-26 NOTE — Telephone Encounter (Signed)
I can understand the constipation--that might be better with some extra water daily. The SOB doesn't make sense--not sure what to tell him since I have not seen him in a while. Theoretically, the furosemide should help SOB so if he has ongoing problems, he will need to be seen

## 2016-02-26 NOTE — Telephone Encounter (Signed)
Spoke to the pt. He said he was taking the furosemide at night. He realizes that is not best. He said he is not sure the SOB is coming from the furosemide. He has an appointment this month. He will call for a sooner appointment if he is not feeling better.

## 2016-03-01 ENCOUNTER — Encounter: Payer: Self-pay | Admitting: Internal Medicine

## 2016-03-10 ENCOUNTER — Encounter: Payer: Self-pay | Admitting: Internal Medicine

## 2016-03-10 ENCOUNTER — Ambulatory Visit (INDEPENDENT_AMBULATORY_CARE_PROVIDER_SITE_OTHER): Payer: Medicare Other | Admitting: Internal Medicine

## 2016-03-10 VITALS — BP 110/82 | HR 97 | Temp 97.5°F | Ht 65.0 in | Wt 176.0 lb

## 2016-03-10 DIAGNOSIS — Z23 Encounter for immunization: Secondary | ICD-10-CM | POA: Diagnosis not present

## 2016-03-10 DIAGNOSIS — I5032 Chronic diastolic (congestive) heart failure: Secondary | ICD-10-CM

## 2016-03-10 DIAGNOSIS — E1140 Type 2 diabetes mellitus with diabetic neuropathy, unspecified: Secondary | ICD-10-CM | POA: Diagnosis not present

## 2016-03-10 DIAGNOSIS — J449 Chronic obstructive pulmonary disease, unspecified: Secondary | ICD-10-CM

## 2016-03-10 DIAGNOSIS — I350 Nonrheumatic aortic (valve) stenosis: Secondary | ICD-10-CM | POA: Diagnosis not present

## 2016-03-10 LAB — CBC WITH DIFFERENTIAL/PLATELET
BASOS ABS: 0.1 10*3/uL (ref 0.0–0.1)
Basophils Relative: 0.7 % (ref 0.0–3.0)
Eosinophils Absolute: 0.4 10*3/uL (ref 0.0–0.7)
Eosinophils Relative: 4.8 % (ref 0.0–5.0)
HCT: 37 % — ABNORMAL LOW (ref 39.0–52.0)
HEMOGLOBIN: 12.9 g/dL — AB (ref 13.0–17.0)
LYMPHS ABS: 1.8 10*3/uL (ref 0.7–4.0)
Lymphocytes Relative: 23.3 % (ref 12.0–46.0)
MCHC: 34.7 g/dL (ref 30.0–36.0)
MCV: 96.8 fl (ref 78.0–100.0)
MONO ABS: 0.6 10*3/uL (ref 0.1–1.0)
MONOS PCT: 7.9 % (ref 3.0–12.0)
NEUTROS PCT: 63.3 % (ref 43.0–77.0)
Neutro Abs: 5 10*3/uL (ref 1.4–7.7)
Platelets: 171 10*3/uL (ref 150.0–400.0)
RBC: 3.82 Mil/uL — AB (ref 4.22–5.81)
RDW: 14.9 % (ref 11.5–15.5)
WBC: 7.9 10*3/uL (ref 4.0–10.5)

## 2016-03-10 LAB — LIPID PANEL
CHOLESTEROL: 144 mg/dL (ref 0–200)
HDL: 39.5 mg/dL (ref >39.00)
LDL Cholesterol: 76 mg/dL (ref 0–99)
NonHDL: 104.59
Total CHOL/HDL Ratio: 4
Triglycerides: 145 mg/dL (ref 0.0–149.0)
VLDL: 29 mg/dL (ref 0.0–40.0)

## 2016-03-10 LAB — COMPREHENSIVE METABOLIC PANEL
ALBUMIN: 4.5 g/dL (ref 3.5–5.2)
ALK PHOS: 68 U/L (ref 39–117)
ALT: 17 U/L (ref 0–53)
AST: 20 U/L (ref 0–37)
BILIRUBIN TOTAL: 1.1 mg/dL (ref 0.2–1.2)
BUN: 22 mg/dL (ref 6–23)
CO2: 26 mEq/L (ref 19–32)
Calcium: 9.7 mg/dL (ref 8.4–10.5)
Chloride: 101 mEq/L (ref 96–112)
Creatinine, Ser: 1.39 mg/dL (ref 0.40–1.50)
GFR: 53.81 mL/min — AB (ref >60.00)
GLUCOSE: 206 mg/dL — AB (ref 70–99)
Potassium: 3.8 mEq/L (ref 3.5–5.1)
Sodium: 140 mEq/L (ref 135–145)
TOTAL PROTEIN: 8 g/dL (ref 6.0–8.3)

## 2016-03-10 LAB — HEMOGLOBIN A1C: Hgb A1c MFr Bld: 6.5 % (ref 4.6–6.5)

## 2016-03-10 NOTE — Progress Notes (Signed)
Subjective:    Patient ID: Howard RidgeJohn W Dixon, male    DOB: September 04, 1945, 70 y.o.   MRN: [id number]  HPI Here for follow up of diabetes and other chronic health conditions Had AMW visit recently--reviewed  Still not able to do "what I normally do" Now limited by SOB--he thinks that is due to the furosemide Listed as prn but he is taking every day Given at [facility] [place] where he is now being seen Told him he has fluid in the lungs Did have early diastolic dysfunction in 2011--but just had another echo in November  Did have recent blood work from the [facility] Told that his diabetes has been fine He checks sugars occasionally--- 150's in AM  Back is better still No sig pain  No chest pain Occasionally gets sensation of racing heart--like if he is angry Having lots of personal stress-- stepdaughter and family living with him and this is very stressful. Even spoke to lawyer about having them leave---no clear options Sleeps on 2 pillows-- no PND No peripheral edema  Current Outpatient Prescriptions on File Prior to Visit  Medication Sig Dispense Refill  . albuterol (PROVENTIL HFA;VENTOLIN HFA) 108 (90 Base) MCG/ACT inhaler Inhale 2 puffs into the lungs 4 (four) times daily as needed. 3 Inhaler 4  . aspirin 81 MG tablet Take 81 mg by mouth daily.    Marland Kitchen. atorvastatin (LIPITOR) 10 MG tablet Take 10 mg by mouth daily at 6 PM. NOT SURE OF DOSE    . BD ULTRA-FINE LANCETS lancets 1 each by Other route. Use as instructed    . Cholecalciferol (VITAMIN D-3) 1000 UNITS CAPS Take 1 capsule by mouth daily.    Marland Kitchen. docusate sodium (COLACE) 100 MG capsule Take 1 capsule (100 mg total) by mouth daily as needed for mild constipation. 10 capsule 0  . flunisolide (NASAREL) 29 MCG/ACT (0.025%) nasal spray Place 2 sprays into the nose 2 (two) times daily. Dose is for each nostril.    Marland Kitchen. glipiZIDE (GLUCOTROL) 10 MG tablet Take 10 mg by mouth 2 (two) times daily before a meal.    . glucose blood (PRECISION XTRA TEST  STRIPS) test strip 1 each by Other route as needed. Use as instructed    . hydrocerin (EUCERIN) CREA Apply 1 application topically 2 (two) times daily.    Marland Kitchen. lisinopril (PRINIVIL,ZESTRIL) 10 MG tablet Take 1 tablet (10 mg total) by mouth daily. 90 tablet 3  . loratadine (CLARITIN) 10 MG tablet Take 10 mg by mouth daily.    . metFORMIN (GLUCOPHAGE) 1000 MG tablet Take 1 tablet (1,000 mg total) by mouth 2 (two) times daily with a meal. 180 tablet 3  . metoprolol (LOPRESSOR) 50 MG tablet Take 50 mg by mouth 2 (two) times daily.     . polyethylene glycol (MIRALAX / GLYCOLAX) packet Take 17 g by mouth daily. 14 each 0  . ranitidine (ZANTAC) 150 MG capsule Take 150 mg by mouth 2 (two) times daily.     No current facility-administered medications on file prior to visit.     Allergies  Allergen Reactions  . Sulfa Antibiotics Rash    Past Medical History:  Diagnosis Date  . Allergic rhinitis due to pollen   . COPD (chronic obstructive pulmonary disease) (HCC)   . Coronary artery disease, occlusive   . GERD (gastroesophageal reflux disease)   . Hyperlipidemia   . Hypertension   . Type II or unspecified type diabetes mellitus with neurological manifestations, not stated as uncontrolled(250.60)  Past Surgical History:  Procedure Laterality Date  . BACK SURGERY    . HERNIA REPAIR    . LUMBAR LAMINECTOMY/DECOMPRESSION MICRODISCECTOMY Left 07/29/2015   Procedure: Left Lumbar two-three microdiscectomy;  Surgeon: Loura HaltBenjamin Jared Ditty, MD;  Location: [facility];  Service: Neurosurgery;  Laterality: Left;    Family History  Problem Relation Age of Onset  . Diabetes Mother   . Hypertension Mother   . Diabetes Father   . Heart disease Father   . Hypertension Father   . Diabetes Sister   . Hypertension Sister   . Diabetes Brother   . Heart disease Brother   . Hypertension Brother   . Diabetes Brother   . Hypertension Brother   . Diabetes Brother   . Heart disease Brother   . Kidney  disease Brother   . Hypertension Brother   . Cancer Neg Hx     Social History   Social History  . Marital status: Married    Spouse name: N/A  . Number of children: 3  . Years of education: N/A   Occupational History  . Truck driver--retired due to medical problems    Social History Main Topics  . Smoking status: Current Every Day Smoker    Packs/day: 1.00    Types: Cigarettes    Start date: 03/22/1962  . Smokeless tobacco: Never Used     Comment: Just not willing to stop  . Alcohol use No  . Drug use: No  . Sexual activity: No   Other Topics Concern  . Not on file   Social History Narrative   1 son--in prison for a long time      No living will   Not sure about health care POA---having some marital issues   Review of Systems Sleeps fairly well Appetite is not great Weight is stable--is weighing himself daily. Home weight 174#    Objective:   Physical Exam  Constitutional: He appears well-nourished. No distress.  Neck: Normal range of motion. Neck supple.  Cardiovascular: Normal rate, regular rhythm and intact distal pulses.  Exam reveals no gallop.   Gr 3/6 aortic systolic murmur  Pulmonary/Chest:  Decreased breath sounds but clear  Musculoskeletal: He exhibits no edema.  Lymphadenopathy:    He has no cervical adenopathy.  Psychiatric: He has a normal mood and affect. His behavior is normal.          Assessment & Plan:

## 2016-03-10 NOTE — Addendum Note (Signed)
Addended by: Eual FinesBRIDGES, Laron Boorman P on: 03/10/2016 12:34 PM   Modules accepted: Orders

## 2016-03-10 NOTE — Assessment & Plan Note (Signed)
Not sure about control Will check labs No sig pain in legs

## 2016-03-10 NOTE — Patient Instructions (Addendum)
Please only take the furosemide if your weight at home is over 177# in the morning. Please get me a copy of the echocardiogram done at the [facility]

## 2016-03-10 NOTE — Assessment & Plan Note (Signed)
Sounds like his diagnosis as of [facility] Will change the furosemide to weight based Need to get his echo

## 2016-03-10 NOTE — Progress Notes (Signed)
Pre visit review using our clinic review tool, if applicable. No additional management support is needed unless otherwise documented below in the visit note. 

## 2016-03-10 NOTE — Assessment & Plan Note (Signed)
Classic murmur [facility] about [facility] heart--not sure if this was the issue

## 2016-03-10 NOTE — Assessment & Plan Note (Signed)
May be big reason for his DOE but unclear Has at least cut back on cigarettes

## 2016-03-14 DIAGNOSIS — R0602 Shortness of breath: Secondary | ICD-10-CM | POA: Diagnosis not present

## 2016-03-18 ENCOUNTER — Emergency Department (HOSPITAL_COMMUNITY): Payer: Medicare Other

## 2016-03-18 ENCOUNTER — Encounter (HOSPITAL_COMMUNITY): Payer: Self-pay | Admitting: *Deleted

## 2016-03-18 ENCOUNTER — Emergency Department (HOSPITAL_COMMUNITY)
Admission: EM | Admit: 2016-03-18 | Discharge: 2016-03-18 | Disposition: A | Discharge status: Home / Self Care | Payer: Medicare Other | Attending: Emergency Medicine | Admitting: Emergency Medicine

## 2016-03-18 DIAGNOSIS — J449 Chronic obstructive pulmonary disease, unspecified: Secondary | ICD-10-CM | POA: Diagnosis not present

## 2016-03-18 DIAGNOSIS — I2510 Atherosclerotic heart disease of native coronary artery without angina pectoris: Secondary | ICD-10-CM | POA: Insufficient documentation

## 2016-03-18 DIAGNOSIS — F17210 Nicotine dependence, cigarettes, uncomplicated: Secondary | ICD-10-CM | POA: Insufficient documentation

## 2016-03-18 DIAGNOSIS — Z7984 Long term (current) use of oral hypoglycemic drugs: Secondary | ICD-10-CM | POA: Insufficient documentation

## 2016-03-18 DIAGNOSIS — I5032 Chronic diastolic (congestive) heart failure: Secondary | ICD-10-CM | POA: Insufficient documentation

## 2016-03-18 DIAGNOSIS — Z7982 Long term (current) use of aspirin: Secondary | ICD-10-CM | POA: Insufficient documentation

## 2016-03-18 DIAGNOSIS — I110 Hypertensive heart disease with heart failure: Secondary | ICD-10-CM | POA: Insufficient documentation

## 2016-03-18 DIAGNOSIS — E119 Type 2 diabetes mellitus without complications: Secondary | ICD-10-CM | POA: Insufficient documentation

## 2016-03-18 DIAGNOSIS — R0602 Shortness of breath: Secondary | ICD-10-CM | POA: Diagnosis not present

## 2016-03-18 DIAGNOSIS — Z79899 Other long term (current) drug therapy: Secondary | ICD-10-CM | POA: Diagnosis not present

## 2016-03-18 DIAGNOSIS — I509 Heart failure, unspecified: Secondary | ICD-10-CM

## 2016-03-18 LAB — I-STAT TROPONIN, ED
TROPONIN I, POC: 0.02 ng/mL (ref 0.00–0.08)
TROPONIN I, POC: 0.02 ng/mL (ref 0.00–0.08)

## 2016-03-18 LAB — COMPREHENSIVE METABOLIC PANEL
ALT: 17 U/L (ref 17–63)
AST: 24 U/L (ref 15–41)
Albumin: 4.1 g/dL (ref 3.5–5.0)
Alkaline Phosphatase: 54 U/L (ref 38–126)
Anion gap: 9 (ref 5–15)
BILIRUBIN TOTAL: 1.1 mg/dL (ref 0.3–1.2)
BUN: 28 mg/dL — ABNORMAL HIGH (ref 6–20)
CHLORIDE: 100 mmol/L — AB (ref 101–111)
CO2: 28 mmol/L (ref 22–32)
CREATININE: 1.5 mg/dL — AB (ref 0.61–1.24)
Calcium: 9.6 mg/dL (ref 8.9–10.3)
GFR, EST AFRICAN AMERICAN: 53 mL/min — AB (ref >60)
GFR, EST NON AFRICAN AMERICAN: 46 mL/min — AB (ref >60)
Glucose, Bld: 135 mg/dL — ABNORMAL HIGH (ref 65–99)
POTASSIUM: 3.5 mmol/L (ref 3.5–5.1)
Sodium: 137 mmol/L (ref 135–145)
TOTAL PROTEIN: 7.9 g/dL (ref 6.5–8.1)

## 2016-03-18 LAB — CBC WITH DIFFERENTIAL/PLATELET
Basophils Absolute: 0.1 10*3/uL (ref 0.0–0.1)
Basophils Relative: 1 %
EOS PCT: 5 %
Eosinophils Absolute: 0.4 10*3/uL (ref 0.0–0.7)
HCT: 38.7 % — ABNORMAL LOW (ref 39.0–52.0)
Hemoglobin: 13 g/dL (ref 13.0–17.0)
Lymphocytes Relative: 30 %
Lymphs Abs: 2.3 10*3/uL (ref 0.7–4.0)
MCH: 33.6 pg (ref 26.0–34.0)
MCHC: 33.6 g/dL (ref 30.0–36.0)
MCV: 100 fL (ref 78.0–100.0)
MONO ABS: 0.6 10*3/uL (ref 0.1–1.0)
Monocytes Relative: 8 %
NEUTROS ABS: 4.2 10*3/uL (ref 1.7–7.7)
NEUTROS PCT: 56 %
PLATELETS: 158 10*3/uL (ref 150–400)
RBC: 3.87 MIL/uL — ABNORMAL LOW (ref 4.22–5.81)
RDW: 14.3 % (ref 11.5–15.5)
WBC: 7.6 10*3/uL (ref 4.0–10.5)

## 2016-03-18 LAB — BRAIN NATRIURETIC PEPTIDE: B NATRIURETIC PEPTIDE 5: 923 pg/mL — AB (ref 0.0–100.0)

## 2016-03-18 MED ORDER — FUROSEMIDE 10 MG/ML IJ SOLN
40.0000 mg | Freq: Once | INTRAMUSCULAR | Status: AC
  Administered 2016-03-18: 40 mg via INTRAVENOUS
  Filled 2016-03-18: qty 4

## 2016-03-18 NOTE — Discharge Instructions (Signed)
Continue to take your lasix 40 mg two times daily  Follow-up with your primary care doctor as scheduled  Return for worsening symptoms, including worsening difficulty breathing, severe chest pain, worsening swelling, or any other symptoms concerning to you.

## 2016-03-18 NOTE — ED Notes (Signed)
Pt ambulated around nurses station with pulse ox without difficulty. O2-98% HR-109

## 2016-03-18 NOTE — ED Triage Notes (Signed)
Pt c/o sob for the last couple of months;

## 2016-03-18 NOTE — ED Provider Notes (Signed)
[facility] DEPT Provider Note   CSN: [id number] Arrival date & time: 03/18/16  0355     History   Chief Complaint Chief Complaint  Patient presents with  . Shortness of Breath    HPI Howard Dixon is a 70 y.o. male.  HPI  70 year old male who presents with shortness of breath. He has a history of hypertension, hyperlipidemia, CAD, COPD, diabetes, and CHF. Follows with Dr. from [place]. States that he has had shortness of breath with activity for several months now. He also reported several month history of needing to sleep upright due to shortness of breath while lying back. He was seen by his primary care doctor at the [place] earlier today for evaluation of this, and it was felt to be related to his CHF. He is to double his dose of Lasix at home from 40 mg daily to 40 mg twice a day.  He did take 1 dose of his Lasix last night. But was having the same symptoms that he has had over the past several months. Came in for evaluation. No fevers, cough, chest pain, lower extremity edema, calf tenderness, abdominal distention. Past Medical History:  Diagnosis Date  . Allergic rhinitis due to pollen   . COPD (chronic obstructive pulmonary disease) (HCC)   . Coronary artery disease, occlusive   . GERD (gastroesophageal reflux disease)   . Hyperlipidemia   . Hypertension   . Type II or unspecified type diabetes mellitus with neurological manifestations, not stated as uncontrolled(250.60)     Patient Active Problem List   Diagnosis Date Noted  . Chronic diastolic heart failure (HCC) 03/10/2016  . Aortic stenosis 08/06/2015  . Herniated nucleus pulposis of lumbosacral region 07/28/2015  . 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 Spinal stenosis 07/28/2015  . Low back pain 07/28/2015  . Nicotine dependence 04/05/2012  . Allergic rhinitis due to pollen   . Coronary artery disease, occlusive   . Hyperlipidemia   . Hypertension   . Type 2 diabetes, controlled, with neuropathy  (HCC)   . GERD (gastroesophageal reflux disease)   . COPD (chronic obstructive pulmonary disease) (HCC)     Past Surgical History:  Procedure Laterality Date  . BACK SURGERY    . HERNIA REPAIR    . LUMBAR LAMINECTOMY/DECOMPRESSION MICRODISCECTOMY Left 07/29/2015   Procedure: Left Lumbar two-three microdiscectomy;  Surgeon: Loura Halt Ditty, MD;  Location: [facility];  Service: Neurosurgery;  Laterality: Left;       Home Medications    Prior to Admission medications   Medication Sig Start Date End Date Taking? Authorizing Provider  albuterol (PROVENTIL HFA;VENTOLIN HFA) 108 (90 Base) MCG/ACT inhaler Inhale 2 puffs into the lungs 4 (four) times daily as needed. 12/09/15   Karie Schwalbe, MD  aspirin 81 MG tablet Take 81 mg by mouth daily.    Historical Provider, MD  atorvastatin (LIPITOR) 10 MG tablet Take 10 mg by mouth daily at 6 PM. NOT SURE OF DOSE    Historical Provider, MD  BD ULTRA-FINE LANCETS lancets 1 each by Other route. Use as instructed    Historical Provider, MD  Cholecalciferol (VITAMIN D-3) 1000 UNITS CAPS Take 1 capsule by mouth daily.    Historical Provider, MD  docusate sodium (COLACE) 100 MG capsule Take 1 capsule (100 mg total) by mouth daily as needed for mild constipation. 07/30/15   Rolly Salter, MD  flunisolide (NASAREL) 29 MCG/ACT (0.025%) nasal spray Place 2 sprays into the nose 2 (  two) times daily. Dose is for each nostril.    Historical Provider, MD  furosemide (LASIX) 40 MG tablet Take 40 mg by mouth daily as needed.    Historical Provider, MD  glipiZIDE (GLUCOTROL) 10 MG tablet Take 10 mg by mouth 2 (two) times daily before a meal.    Historical Provider, MD  glucose blood (PRECISION XTRA TEST STRIPS) test strip 1 each by Other route as needed. Use as instructed    Historical Provider, MD  hydrocerin (EUCERIN) CREA Apply 1 application topically 2 (two) times daily.    Historical Provider, MD  lisinopril (PRINIVIL,ZESTRIL) 10 MG tablet Take 1 tablet  (10 mg total) by mouth daily. 08/11/15   Karie Schwalbeichard I Letvak, MD  loratadine (CLARITIN) 10 MG tablet Take 10 mg by mouth daily.    Historical Provider, MD  metFORMIN (GLUCOPHAGE) 1000 MG tablet Take 1 tablet (1,000 mg total) by mouth 2 (two) times daily with a meal. 08/11/15   Karie Schwalbeichard I Letvak, MD  metoprolol (LOPRESSOR) 50 MG tablet Take 50 mg by mouth 2 (two) times daily.     Historical Provider, MD  omeprazole (PRILOSEC) 20 MG capsule Take 20 mg by mouth daily.    Historical Provider, MD  polyethylene glycol (MIRALAX / GLYCOLAX) packet Take 17 g by mouth daily. 07/30/15   Rolly SalterPranav M Patel, MD  ranitidine (ZANTAC) 150 MG capsule Take 150 mg by mouth 2 (two) times daily.    Historical Provider, MD    Family History Family History  Problem Relation Age of Onset  . Diabetes Mother   . Hypertension Mother   . Diabetes Father   . Heart disease Father   . Hypertension Father   . Diabetes Sister   . Hypertension Sister   . Diabetes Brother   . Heart disease Brother   . Hypertension Brother   . Diabetes Brother   . Hypertension Brother   . Diabetes Brother   . Heart disease Brother   . Kidney disease Brother   . Hypertension Brother   . Cancer Neg Hx     Social History Social History  Substance Use Topics  . Smoking status: Current Every Day Smoker    Packs/day: 1.00    Types: Cigarettes    Start date: 03/22/1962  . Smokeless tobacco: Never Used     Comment: Just not willing to stop  . Alcohol use No     Allergies   Sulfa antibiotics   Review of Systems Review of Systems 10/14 systems reviewed and are negative other than those stated in the HPI   Physical Exam Updated Vital Signs BP 109/68   Pulse 92   Temp 97.9 F (36.6 C) (Oral)   Resp 24   Ht 5\' 5"  (1.651 m)   Wt 176 lb (79.8 kg)   SpO2 96%   BMI 29.29 kg/m   Physical Exam Physical Exam  Nursing note and vitals reviewed. Constitutional: Well developed, well nourished, non-toxic, and in no acute distress Head:  Normocephalic and atraumatic.  Mouth/Throat: Oropharynx is clear and moist.  Neck: Normal range of motion. Neck supple.  Cardiovascular: Normal rate and regular rhythm.  no edema Pulmonary/Chest: Effort normal and breath sounds normal.  Abdominal: Soft. There is no tenderness. There is no rebound and no guarding.  Musculoskeletal: Normal range of motion.  Neurological: Alert, no facial droop, fluent speech, moves all extremities symmetrically Skin: Skin is warm and dry.  Psychiatric: Cooperative   ED Treatments / Results  Labs (all labs ordered  are listed, but only abnormal results are displayed) Labs Reviewed  CBC WITH DIFFERENTIAL/PLATELET - Abnormal; Notable for the following:       Result Value   RBC 3.87 (*)    HCT 38.7 (*)    All other components within normal limits  COMPREHENSIVE METABOLIC PANEL - Abnormal; Notable for the following:    Chloride 100 (*)    Glucose, Bld 135 (*)    BUN 28 (*)    Creatinine, Ser 1.50 (*)    GFR calc non Af Amer 46 (*)    GFR calc Af Amer 53 (*)    All other components within normal limits  BRAIN NATRIURETIC PEPTIDE - Abnormal; Notable for the following:    B Natriuretic Peptide 923.0 (*)    All other components within normal limits  I-STAT TROPOININ, ED  I-STAT TROPOININ, ED    EKG  EKG Interpretation  Date/Time:  Thursday March 18 2016 04:15:45 EST Ventricular Rate:  95 PR Interval:    QRS Duration: 100 QT Interval:  368 QTC Calculation: 463 R Axis:   -11 Text Interpretation:  Sinus rhythm Ventricular premature complex Inferior infarct, old Anterior infarct, old similar to last EKG Confirmed by Shalamar Plourde MD, Raheel Kunkle [phone]([id number]) on 03/18/2016 4:42:04 AM       Radiology Dg Chest 2 View  Result Date: 03/18/2016 CLINICAL DATA:  Shortness of breath.  Chest tightness. EXAM: CHEST  2 VIEW COMPARISON:  07/28/2015 FINDINGS: Bronchial thickening has increased from prior exam. Minimal scattered atelectasis, no confluent airspace disease. The  heart size and mediastinal contours are unchanged with atherosclerosis of the thoracic aorta. No pleural fluid or pneumothorax. No acute osseous abnormality. IMPRESSION: 1. Bronchial thickening, increased from prior exam, may be acute bronchitis. 2. Thoracic aortic atherosclerosis. Electronically Signed   By: Rubye OaksMelanie  Ehinger M.D.   On: 03/18/2016 05:03    Procedures Procedures (including critical care time)  Medications Ordered in ED Medications  furosemide (LASIX) injection 40 mg (40 mg Intravenous Given 03/18/16 [id number])     Initial Impression / Assessment and Plan / ED Course  I have reviewed the triage vital signs and the nursing notes.  Pertinent labs & imaging results that were available during my care of the patient were reviewed by me and considered in my medical decision making (see chart for details).  Clinical Course     He is well appearing and in no acute distress. Normal work of breathing and normal oxygenation. He was just seen by his PCP for these symptoms, and his symptoms have no changed over several months. CXR visualized with increased bronchial markings, ? Bronchitis, but no infectious symptoms per patient and family. Suspect that this is his underlying heart failure. BNP is elevated. He is given IV 40 mg lasix with good urine output. States that he feels symptomatically improved. Ambulates normally in ED w/ normal oxygenation and normal work of breathing. Serial troponin negative, EKG non-ischemic. I do not suspect atypical ACS presentation.   He will continue 40 mg lasix BID that was prescribed by his PCP earlier today. I do think it is appropriate for continued outpatient treatment as was planned. Strict return and follow-up instructions reviewed. He expressed understanding of all discharge instructions and felt comfortable with the plan of care.    Final Clinical Impressions(s) / ED Diagnoses   Final diagnoses:  Shortness of breath  Acute on chronic congestive  heart failure, unspecified congestive heart failure type [facility](HCC)    New Prescriptions New Prescriptions   No  medications on file     Lavera Guise, MD 03/18/16 [phone]

## 2016-03-24 ENCOUNTER — Telehealth: Payer: Self-pay

## 2016-03-24 NOTE — Telephone Encounter (Signed)
Tried to call pt to follow-up on his recent ER visit. It rang several times then disconnected.

## 2016-03-26 NOTE — Telephone Encounter (Signed)
Left message to call back  

## 2016-05-09 DIAGNOSIS — K5900 Constipation, unspecified: Secondary | ICD-10-CM | POA: Diagnosis not present

## 2016-05-10 DIAGNOSIS — R1030 Lower abdominal pain, unspecified: Secondary | ICD-10-CM | POA: Diagnosis not present

## 2016-05-11 ENCOUNTER — Encounter (HOSPITAL_COMMUNITY): Payer: Self-pay | Admitting: Emergency Medicine

## 2016-05-11 ENCOUNTER — Encounter (HOSPITAL_COMMUNITY): Payer: Self-pay

## 2016-05-11 ENCOUNTER — Inpatient Hospital Stay (HOSPITAL_COMMUNITY)
Admission: EM | Admit: 2016-05-11 | Discharge: 2016-05-20 | DRG: 291 | Disposition: E | Payer: Non-veteran care | Attending: Emergency Medicine | Admitting: Emergency Medicine

## 2016-05-11 ENCOUNTER — Emergency Department (HOSPITAL_COMMUNITY): Payer: Non-veteran care

## 2016-05-11 ENCOUNTER — Inpatient Hospital Stay (HOSPITAL_COMMUNITY): Payer: Non-veteran care

## 2016-05-11 ENCOUNTER — Inpatient Hospital Stay (HOSPITAL_COMMUNITY)
Admit: 2016-05-11 | Discharge: 2016-05-11 | Disposition: A | Discharge status: Home / Self Care | Payer: Non-veteran care | Attending: Internal Medicine | Admitting: Internal Medicine

## 2016-05-11 DIAGNOSIS — R17 Unspecified jaundice: Secondary | ICD-10-CM

## 2016-05-11 DIAGNOSIS — I252 Old myocardial infarction: Secondary | ICD-10-CM

## 2016-05-11 DIAGNOSIS — N183 Chronic kidney disease, stage 3 unspecified: Secondary | ICD-10-CM

## 2016-05-11 DIAGNOSIS — R0602 Shortness of breath: Secondary | ICD-10-CM

## 2016-05-11 DIAGNOSIS — J9811 Atelectasis: Secondary | ICD-10-CM | POA: Diagnosis present

## 2016-05-11 DIAGNOSIS — R109 Unspecified abdominal pain: Secondary | ICD-10-CM | POA: Diagnosis not present

## 2016-05-11 DIAGNOSIS — Z978 Presence of other specified devices: Secondary | ICD-10-CM

## 2016-05-11 DIAGNOSIS — B179 Acute viral hepatitis, unspecified: Secondary | ICD-10-CM | POA: Diagnosis present

## 2016-05-11 DIAGNOSIS — E1122 Type 2 diabetes mellitus with diabetic chronic kidney disease: Secondary | ICD-10-CM | POA: Diagnosis not present

## 2016-05-11 DIAGNOSIS — J9690 Respiratory failure, unspecified, unspecified whether with hypoxia or hypercapnia: Secondary | ICD-10-CM | POA: Diagnosis not present

## 2016-05-11 DIAGNOSIS — I472 Ventricular tachycardia: Secondary | ICD-10-CM | POA: Diagnosis not present

## 2016-05-11 DIAGNOSIS — F17210 Nicotine dependence, cigarettes, uncomplicated: Secondary | ICD-10-CM | POA: Diagnosis present

## 2016-05-11 DIAGNOSIS — K810 Acute cholecystitis: Secondary | ICD-10-CM | POA: Diagnosis not present

## 2016-05-11 DIAGNOSIS — Z4682 Encounter for fitting and adjustment of non-vascular catheter: Secondary | ICD-10-CM | POA: Diagnosis not present

## 2016-05-11 DIAGNOSIS — M79609 Pain in unspecified limb: Secondary | ICD-10-CM | POA: Diagnosis not present

## 2016-05-11 DIAGNOSIS — I272 Pulmonary hypertension, unspecified: Secondary | ICD-10-CM | POA: Diagnosis present

## 2016-05-11 DIAGNOSIS — A419 Sepsis, unspecified organism: Secondary | ICD-10-CM | POA: Diagnosis not present

## 2016-05-11 DIAGNOSIS — E878 Other disorders of electrolyte and fluid balance, not elsewhere classified: Secondary | ICD-10-CM | POA: Diagnosis not present

## 2016-05-11 DIAGNOSIS — R609 Edema, unspecified: Secondary | ICD-10-CM | POA: Diagnosis not present

## 2016-05-11 DIAGNOSIS — I2510 Atherosclerotic heart disease of native coronary artery without angina pectoris: Secondary | ICD-10-CM | POA: Diagnosis present

## 2016-05-11 DIAGNOSIS — K219 Gastro-esophageal reflux disease without esophagitis: Secondary | ICD-10-CM | POA: Diagnosis present

## 2016-05-11 DIAGNOSIS — I25110 Atherosclerotic heart disease of native coronary artery with unstable angina pectoris: Secondary | ICD-10-CM | POA: Diagnosis not present

## 2016-05-11 DIAGNOSIS — D696 Thrombocytopenia, unspecified: Secondary | ICD-10-CM | POA: Diagnosis not present

## 2016-05-11 DIAGNOSIS — I712 Thoracic aortic aneurysm, without rupture: Secondary | ICD-10-CM | POA: Diagnosis present

## 2016-05-11 DIAGNOSIS — I110 Hypertensive heart disease with heart failure: Secondary | ICD-10-CM | POA: Diagnosis not present

## 2016-05-11 DIAGNOSIS — N179 Acute kidney failure, unspecified: Secondary | ICD-10-CM | POA: Diagnosis not present

## 2016-05-11 DIAGNOSIS — K767 Hepatorenal syndrome: Secondary | ICD-10-CM | POA: Diagnosis not present

## 2016-05-11 DIAGNOSIS — J810 Acute pulmonary edema: Secondary | ICD-10-CM | POA: Diagnosis not present

## 2016-05-11 DIAGNOSIS — E876 Hypokalemia: Secondary | ICD-10-CM | POA: Diagnosis present

## 2016-05-11 DIAGNOSIS — J690 Pneumonitis due to inhalation of food and vomit: Secondary | ICD-10-CM | POA: Diagnosis not present

## 2016-05-11 DIAGNOSIS — I10 Essential (primary) hypertension: Secondary | ICD-10-CM | POA: Diagnosis not present

## 2016-05-11 DIAGNOSIS — R570 Cardiogenic shock: Secondary | ICD-10-CM | POA: Diagnosis not present

## 2016-05-11 DIAGNOSIS — E785 Hyperlipidemia, unspecified: Secondary | ICD-10-CM | POA: Diagnosis present

## 2016-05-11 DIAGNOSIS — R579 Shock, unspecified: Secondary | ICD-10-CM | POA: Diagnosis not present

## 2016-05-11 DIAGNOSIS — Z8249 Family history of ischemic heart disease and other diseases of the circulatory system: Secondary | ICD-10-CM

## 2016-05-11 DIAGNOSIS — F17200 Nicotine dependence, unspecified, uncomplicated: Secondary | ICD-10-CM | POA: Diagnosis not present

## 2016-05-11 DIAGNOSIS — R6520 Severe sepsis without septic shock: Secondary | ICD-10-CM | POA: Diagnosis not present

## 2016-05-11 DIAGNOSIS — K7200 Acute and subacute hepatic failure without coma: Secondary | ICD-10-CM | POA: Diagnosis not present

## 2016-05-11 DIAGNOSIS — J9601 Acute respiratory failure with hypoxia: Secondary | ICD-10-CM | POA: Diagnosis present

## 2016-05-11 DIAGNOSIS — R740 Nonspecific elevation of levels of transaminase and lactic acid dehydrogenase [LDH]: Secondary | ICD-10-CM | POA: Diagnosis present

## 2016-05-11 DIAGNOSIS — R3129 Other microscopic hematuria: Secondary | ICD-10-CM | POA: Diagnosis not present

## 2016-05-11 DIAGNOSIS — R7401 Elevation of levels of liver transaminase levels: Secondary | ICD-10-CM

## 2016-05-11 DIAGNOSIS — E871 Hypo-osmolality and hyponatremia: Secondary | ICD-10-CM | POA: Diagnosis present

## 2016-05-11 DIAGNOSIS — K759 Inflammatory liver disease, unspecified: Secondary | ICD-10-CM

## 2016-05-11 DIAGNOSIS — Z7984 Long term (current) use of oral hypoglycemic drugs: Secondary | ICD-10-CM

## 2016-05-11 DIAGNOSIS — J449 Chronic obstructive pulmonary disease, unspecified: Secondary | ICD-10-CM | POA: Diagnosis present

## 2016-05-11 DIAGNOSIS — E872 Acidosis: Secondary | ICD-10-CM | POA: Diagnosis not present

## 2016-05-11 DIAGNOSIS — Z789 Other specified health status: Secondary | ICD-10-CM

## 2016-05-11 DIAGNOSIS — R0789 Other chest pain: Secondary | ICD-10-CM | POA: Diagnosis not present

## 2016-05-11 DIAGNOSIS — E1165 Type 2 diabetes mellitus with hyperglycemia: Secondary | ICD-10-CM | POA: Diagnosis present

## 2016-05-11 DIAGNOSIS — E1149 Type 2 diabetes mellitus with other diabetic neurological complication: Secondary | ICD-10-CM | POA: Diagnosis not present

## 2016-05-11 DIAGNOSIS — G9341 Metabolic encephalopathy: Secondary | ICD-10-CM | POA: Diagnosis not present

## 2016-05-11 DIAGNOSIS — I4891 Unspecified atrial fibrillation: Secondary | ICD-10-CM

## 2016-05-11 DIAGNOSIS — I479 Paroxysmal tachycardia, unspecified: Secondary | ICD-10-CM | POA: Diagnosis not present

## 2016-05-11 DIAGNOSIS — Z66 Do not resuscitate: Secondary | ICD-10-CM | POA: Diagnosis not present

## 2016-05-11 DIAGNOSIS — J189 Pneumonia, unspecified organism: Secondary | ICD-10-CM | POA: Diagnosis not present

## 2016-05-11 DIAGNOSIS — I130 Hypertensive heart and chronic kidney disease with heart failure and stage 1 through stage 4 chronic kidney disease, or unspecified chronic kidney disease: Principal | ICD-10-CM | POA: Diagnosis present

## 2016-05-11 DIAGNOSIS — R945 Abnormal results of liver function studies: Secondary | ICD-10-CM | POA: Diagnosis not present

## 2016-05-11 DIAGNOSIS — R6521 Severe sepsis with septic shock: Secondary | ICD-10-CM | POA: Diagnosis not present

## 2016-05-11 DIAGNOSIS — I248 Other forms of acute ischemic heart disease: Secondary | ICD-10-CM | POA: Diagnosis present

## 2016-05-11 DIAGNOSIS — I5021 Acute systolic (congestive) heart failure: Secondary | ICD-10-CM | POA: Diagnosis not present

## 2016-05-11 DIAGNOSIS — I5023 Acute on chronic systolic (congestive) heart failure: Secondary | ICD-10-CM | POA: Diagnosis present

## 2016-05-11 DIAGNOSIS — Z7982 Long term (current) use of aspirin: Secondary | ICD-10-CM

## 2016-05-11 DIAGNOSIS — E119 Type 2 diabetes mellitus without complications: Secondary | ICD-10-CM | POA: Diagnosis not present

## 2016-05-11 DIAGNOSIS — D72829 Elevated white blood cell count, unspecified: Secondary | ICD-10-CM | POA: Diagnosis present

## 2016-05-11 DIAGNOSIS — Z515 Encounter for palliative care: Secondary | ICD-10-CM | POA: Diagnosis not present

## 2016-05-11 DIAGNOSIS — J438 Other emphysema: Secondary | ICD-10-CM | POA: Diagnosis not present

## 2016-05-11 DIAGNOSIS — R7989 Other specified abnormal findings of blood chemistry: Secondary | ICD-10-CM | POA: Diagnosis present

## 2016-05-11 DIAGNOSIS — I2781 Cor pulmonale (chronic): Secondary | ICD-10-CM | POA: Diagnosis present

## 2016-05-11 DIAGNOSIS — Z79899 Other long term (current) drug therapy: Secondary | ICD-10-CM

## 2016-05-11 DIAGNOSIS — Z888 Allergy status to other drugs, medicaments and biological substances status: Secondary | ICD-10-CM

## 2016-05-11 DIAGNOSIS — I509 Heart failure, unspecified: Secondary | ICD-10-CM | POA: Diagnosis not present

## 2016-05-11 DIAGNOSIS — R34 Anuria and oliguria: Secondary | ICD-10-CM | POA: Diagnosis not present

## 2016-05-11 DIAGNOSIS — Z7951 Long term (current) use of inhaled steroids: Secondary | ICD-10-CM

## 2016-05-11 DIAGNOSIS — I441 Atrioventricular block, second degree: Secondary | ICD-10-CM | POA: Diagnosis not present

## 2016-05-11 DIAGNOSIS — I5043 Acute on chronic combined systolic (congestive) and diastolic (congestive) heart failure: Secondary | ICD-10-CM | POA: Diagnosis not present

## 2016-05-11 DIAGNOSIS — I4892 Unspecified atrial flutter: Secondary | ICD-10-CM | POA: Diagnosis not present

## 2016-05-11 DIAGNOSIS — I350 Nonrheumatic aortic (valve) stenosis: Secondary | ICD-10-CM | POA: Diagnosis present

## 2016-05-11 DIAGNOSIS — Z833 Family history of diabetes mellitus: Secondary | ICD-10-CM

## 2016-05-11 DIAGNOSIS — E875 Hyperkalemia: Secondary | ICD-10-CM | POA: Diagnosis not present

## 2016-05-11 DIAGNOSIS — I255 Ischemic cardiomyopathy: Secondary | ICD-10-CM | POA: Diagnosis present

## 2016-05-11 DIAGNOSIS — K7460 Unspecified cirrhosis of liver: Secondary | ICD-10-CM | POA: Diagnosis present

## 2016-05-11 DIAGNOSIS — E1140 Type 2 diabetes mellitus with diabetic neuropathy, unspecified: Secondary | ICD-10-CM | POA: Diagnosis present

## 2016-05-11 DIAGNOSIS — R05 Cough: Secondary | ICD-10-CM | POA: Diagnosis not present

## 2016-05-11 DIAGNOSIS — J9600 Acute respiratory failure, unspecified whether with hypoxia or hypercapnia: Secondary | ICD-10-CM | POA: Diagnosis not present

## 2016-05-11 DIAGNOSIS — Z841 Family history of disorders of kidney and ureter: Secondary | ICD-10-CM

## 2016-05-11 DIAGNOSIS — Z882 Allergy status to sulfonamides status: Secondary | ICD-10-CM

## 2016-05-11 DIAGNOSIS — R079 Chest pain, unspecified: Secondary | ICD-10-CM

## 2016-05-11 LAB — I-STAT TROPONIN, ED: Troponin i, poc: 0.08 ng/mL (ref 0.00–0.08)

## 2016-05-11 LAB — CBC WITH DIFFERENTIAL/PLATELET
BASOS ABS: 0 10*3/uL (ref 0.0–0.1)
Basophils Relative: 0 %
EOS ABS: 0 10*3/uL (ref 0.0–0.7)
EOS PCT: 0 %
HEMATOCRIT: 36.5 % — AB (ref 39.0–52.0)
Hemoglobin: 12.7 g/dL — ABNORMAL LOW (ref 13.0–17.0)
Lymphocytes Relative: 16 %
Lymphs Abs: 1.9 10*3/uL (ref 0.7–4.0)
MCH: 32.8 pg (ref 26.0–34.0)
MCHC: 34.8 g/dL (ref 30.0–36.0)
MCV: 94.3 fL (ref 78.0–100.0)
MONO ABS: 0.8 10*3/uL (ref 0.1–1.0)
MONOS PCT: 7 %
Neutro Abs: 9.6 10*3/uL — ABNORMAL HIGH (ref 1.7–7.7)
Neutrophils Relative %: 77 %
PLATELETS: 144 10*3/uL — AB (ref 150–400)
RBC: 3.87 MIL/uL — ABNORMAL LOW (ref 4.22–5.81)
RDW: 14.6 % (ref 11.5–15.5)
WBC: 12.3 10*3/uL — ABNORMAL HIGH (ref 4.0–10.5)

## 2016-05-11 LAB — URINALYSIS, ROUTINE W REFLEX MICROSCOPIC
BACTERIA UA: NONE SEEN
BILIRUBIN URINE: NEGATIVE
GLUCOSE, UA: NEGATIVE mg/dL
Ketones, ur: NEGATIVE mg/dL
LEUKOCYTES UA: NEGATIVE
NITRITE: NEGATIVE
PROTEIN: 100 mg/dL — AB
SPECIFIC GRAVITY, URINE: 1.019 (ref 1.005–1.030)
Squamous Epithelial / LPF: NONE SEEN
pH: 5 (ref 5.0–8.0)

## 2016-05-11 LAB — TROPONIN I
Troponin I: 0.1 ng/mL (ref <0.03)
Troponin I: 0.17 ng/mL (ref <0.03)
Troponin I: 0.16 ng/mL (ref <0.03)

## 2016-05-11 LAB — COMPREHENSIVE METABOLIC PANEL
ALBUMIN: 4.1 g/dL (ref 3.5–5.0)
ALT: 2232 U/L — ABNORMAL HIGH (ref 17–63)
ANION GAP: 15 (ref 5–15)
AST: 2242 U/L — AB (ref 15–41)
Alkaline Phosphatase: 74 U/L (ref 38–126)
BILIRUBIN TOTAL: 2.6 mg/dL — AB (ref 0.3–1.2)
BUN: 72 mg/dL — ABNORMAL HIGH (ref 6–20)
CHLORIDE: 92 mmol/L — AB (ref 101–111)
CO2: 19 mmol/L — ABNORMAL LOW (ref 22–32)
Calcium: 10.5 mg/dL — ABNORMAL HIGH (ref 8.9–10.3)
Creatinine, Ser: 2.31 mg/dL — ABNORMAL HIGH (ref 0.61–1.24)
GFR calc Af Amer: 31 mL/min — ABNORMAL LOW (ref >60)
GFR calc non Af Amer: 27 mL/min — ABNORMAL LOW (ref >60)
GLUCOSE: 63 mg/dL — AB (ref 65–99)
POTASSIUM: 5.7 mmol/L — AB (ref 3.5–5.1)
SODIUM: 126 mmol/L — AB (ref 135–145)
TOTAL PROTEIN: 7.8 g/dL (ref 6.5–8.1)

## 2016-05-11 LAB — MAGNESIUM: MAGNESIUM: 2.5 mg/dL — AB (ref 1.7–2.4)

## 2016-05-11 LAB — PROTIME-INR
INR: 1.79
PROTHROMBIN TIME: 21.1 s — AB (ref 11.4–15.2)

## 2016-05-11 LAB — ACETAMINOPHEN LEVEL: Acetaminophen (Tylenol), Serum: <10 ug/mL — ABNORMAL LOW (ref 10–30)

## 2016-05-11 LAB — GLUCOSE, CAPILLARY: GLUCOSE-CAPILLARY: 110 mg/dL — AB (ref 65–99)

## 2016-05-11 LAB — CBG MONITORING, ED: Glucose-Capillary: 87 mg/dL (ref 65–99)

## 2016-05-11 LAB — CK: Total CK: 612 U/L — ABNORMAL HIGH (ref 49–397)

## 2016-05-11 LAB — LIPASE, BLOOD: Lipase: 41 U/L (ref 11–51)

## 2016-05-11 MED ORDER — VITAMIN D3 25 MCG (1000 UNIT) PO TABS
1000.0000 [IU] | ORAL_TABLET | Freq: Every day | ORAL | Status: DC
  Administered 2016-05-11 – 2016-05-17 (×6): 1000 [IU] via ORAL
  Filled 2016-05-11 (×6): qty 1

## 2016-05-11 MED ORDER — ONDANSETRON HCL 4 MG/2ML IJ SOLN: 4.0000 mg | Freq: Four times a day (QID) | INTRAMUSCULAR | Status: DC | PRN

## 2016-05-11 MED ORDER — IOPAMIDOL (ISOVUE-300) INJECTION 61%
INTRAVENOUS | Status: AC
  Administered 2016-05-11: 30 mL via ORAL
  Filled 2016-05-11: qty 30

## 2016-05-11 MED ORDER — HEPARIN SODIUM (PORCINE) 5000 UNIT/ML IJ SOLN
5000.0000 [IU] | Freq: Three times a day (TID) | INTRAMUSCULAR | Status: DC
  Administered 2016-05-11 – 2016-05-14 (×8): 5000 [IU] via SUBCUTANEOUS
  Filled 2016-05-11 (×8): qty 1

## 2016-05-11 MED ORDER — ONDANSETRON HCL 4 MG/2ML IJ SOLN
4.0000 mg | Freq: Once | INTRAMUSCULAR | Status: AC
  Administered 2016-05-11: 4 mg via INTRAVENOUS
  Filled 2016-05-11: qty 2

## 2016-05-11 MED ORDER — IPRATROPIUM-ALBUTEROL 0.5-2.5 (3) MG/3ML IN SOLN
3.0000 mL | Freq: Three times a day (TID) | RESPIRATORY_TRACT | Status: DC
  Administered 2016-05-11 – 2016-05-13 (×8): 3 mL via RESPIRATORY_TRACT
  Filled 2016-05-11 (×10): qty 3

## 2016-05-11 MED ORDER — FUROSEMIDE 10 MG/ML IJ SOLN
40.0000 mg | Freq: Two times a day (BID) | INTRAMUSCULAR | Status: DC
  Administered 2016-05-11 – 2016-05-13 (×4): 40 mg via INTRAVENOUS
  Filled 2016-05-11 (×4): qty 4

## 2016-05-11 MED ORDER — MORPHINE SULFATE (PF) 4 MG/ML IV SOLN
4.0000 mg | Freq: Once | INTRAVENOUS | Status: AC
  Administered 2016-05-11: 4 mg via INTRAVENOUS
  Filled 2016-05-11: qty 1

## 2016-05-11 MED ORDER — FLUTICASONE PROPIONATE 50 MCG/ACT NA SUSP
1.0000 | Freq: Every day | NASAL | Status: DC
  Administered 2016-05-11 – 2016-05-17 (×7): 1 via NASAL
  Filled 2016-05-11 (×2): qty 16

## 2016-05-11 MED ORDER — PANTOPRAZOLE SODIUM 40 MG PO TBEC
40.0000 mg | DELAYED_RELEASE_TABLET | Freq: Every day | ORAL | Status: DC
  Administered 2016-05-11 – 2016-05-14 (×4): 40 mg via ORAL
  Filled 2016-05-11 (×4): qty 1

## 2016-05-11 MED ORDER — SODIUM POLYSTYRENE SULFONATE 15 GM/60ML PO SUSP
30.0000 g | Freq: Once | ORAL | Status: AC
  Administered 2016-05-11: 30 g via ORAL
  Filled 2016-05-11: qty 120

## 2016-05-11 MED ORDER — SODIUM CHLORIDE 0.9% FLUSH: 3.0000 mL | INTRAVENOUS | Status: DC | PRN

## 2016-05-11 MED ORDER — SODIUM CHLORIDE 0.9 % IV SOLN: 250.0000 mL | INTRAVENOUS | Status: DC | PRN

## 2016-05-11 MED ORDER — ACETAMINOPHEN 325 MG PO TABS: 650.0000 mg | ORAL_TABLET | ORAL | Status: DC | PRN

## 2016-05-11 MED ORDER — INSULIN ASPART 100 UNIT/ML ~~LOC~~ SOLN
0.0000 [IU] | Freq: Three times a day (TID) | SUBCUTANEOUS | Status: DC
  Administered 2016-05-12: 2 [IU] via SUBCUTANEOUS
  Administered 2016-05-12: 1 [IU] via SUBCUTANEOUS
  Administered 2016-05-12 – 2016-05-13 (×2): 2 [IU] via SUBCUTANEOUS
  Administered 2016-05-13: 3 [IU] via SUBCUTANEOUS
  Administered 2016-05-13: 2 [IU] via SUBCUTANEOUS
  Administered 2016-05-14: 3 [IU] via SUBCUTANEOUS
  Administered 2016-05-14: 2 [IU] via SUBCUTANEOUS

## 2016-05-11 MED ORDER — ASPIRIN EC 81 MG PO TBEC
81.0000 mg | DELAYED_RELEASE_TABLET | Freq: Every evening | ORAL | Status: DC
  Administered 2016-05-11 – 2016-05-13 (×3): 81 mg via ORAL
  Filled 2016-05-11 (×4): qty 1

## 2016-05-11 MED ORDER — SODIUM CHLORIDE 0.9% FLUSH
3.0000 mL | Freq: Two times a day (BID) | INTRAVENOUS | Status: DC
  Administered 2016-05-11 – 2016-05-16 (×10): 3 mL via INTRAVENOUS

## 2016-05-11 MED ORDER — IOPAMIDOL (ISOVUE-300) INJECTION 61%
30.0000 mL | Freq: Once | INTRAVENOUS | Status: AC | PRN
  Administered 2016-05-11: 30 mL via ORAL

## 2016-05-11 NOTE — H&P (Addendum)
History and Physical  KEE DRUDGE ONG:[id number] DOB: 28-May-1945 DOA: 2016/05/18   PCP: Tillman Abide, MD   Patient coming from: Home  Chief Complaint: Abdominal pain, shortness of breath and cough  HPI:  Howard Dixon is a 71 y.o. male with medical history of systolic CHF with EF 25-30 percent, coronary artery disease with CTO of pLAD, COPD with continued tobacco abuse, hypertension, diabetes mellitus, descending aortic aneurysm presenting with one-week history of abdominal pain and worsening shortness of breath. The patient states that eating and activity occasionally exacerbates his abdominal pain. He felt that his furosemide, which he was started on one month ago, caused worsening abdominal pain. As a result, he stopped taking it 1 week prior to this admission. Since then, the patient has had increasing shortness of breath and coughing. The patient has had increasing lower extremity edema in the past week with 5-6 pound weight gain. The patient is having post tussive emesis. He denies any fevers, chills, hematemesis, hemoptysis, hematochezia, melena, dysuria, hematuria. The patient complains of constipation with his last bowel movement 3 days prior to this admission. The patient states that he has had chest pain chronically usually at rest. However, he does describe some dyspnea on exertion walking up just four steps at home. The patient recently saw thoracic surgery at [facility] on 04/28/2016 for incidental finding of a ascending aortic aneurysm found on CT of the chest. He was not deemed to be a good surgical candidate.  In the emergency department, the patient was afebrile and hemodynamically stable with soft blood pressures in the low 100s. The patient was noted to be hyponatremic with sodium 126, potassium 5.7, and serum creatinine 2.31. WBC was 12.3 with platelets 144,000. EKG so sinus rhythm with nonspecific T-wave changes. Urinalysis was negative for pyuria. AST was 2242, and ALT 2232,  alkaline phosphatase 74, total bilirubin 2.6, lipase 41.  CT of the abdomen and pelvis without contrast showed interlobular septal prominence suggestive of interstitial edema, irregular hepatic contour with a large left hepatic lobe with suggestion of early cirrhosis. There was gallbladder sludge versus cholelithiasis without ductal dilatation. There was anasarca and small amount of ascites.  Assessment/Plan: Acute on chronic systolic CHF -EF 44-01 percent -daily weights -accurate I/O's -fluid restrict -Echo -start IV Lasix 40 mg bid-->monitor renal function  Transaminasemia -suspect hepatic congestion from Cor Pulmonale/ CHF -no new meds per pt -acetaminophen level negatve -Viral hepatitis panel -RUQ [place] -trend LFTs -CMV DNA -EBV DNA -d/c statin  Acute on chronic renal failure--CKD 3 -monitor with diuresis -holding lisinopril -check CK -am BMP  Hyperkalemia -kayexalate x 1 -telemetry -anticipate improvement with diuresis also -d/c lisinopril and Kcl supplement  Atypical chest pain -cycle troponins -EKG without concerning ischemic changes -continue ASA -CXR  Hyponatremia -due to fluid overload and possible underlying cirrhosis and acute renal failure causing abnormal Na reabsorption -diurese -am BMP  Leukocytosis -likely stress demargination -UA negative for pyuria -order CXR -blood cultures x 2 sets  COPD -he does not qualify for home oxygen, but he uses his wife's oxygen prn -stable on 2 L-->100% -Duonebs q 8  DM2 -novolog sliding scale -holding glipizide and metformin -check A1C  HTN -holding metoprolol due to soft BP  Leg pain and edema -venous duplex         Past Medical History:  Diagnosis Date  . Allergic rhinitis due to pollen   . COPD (chronic obstructive pulmonary disease) (HCC)   . Coronary artery disease, occlusive   .  GERD (gastroesophageal reflux disease)   . Hyperlipidemia   . Hypertension   . Type II or unspecified type  diabetes mellitus with neurological manifestations, not stated as uncontrolled(250.60)    Past Surgical History:  Procedure Laterality Date  . BACK SURGERY    . HERNIA REPAIR    . LUMBAR LAMINECTOMY/DECOMPRESSION MICRODISCECTOMY Left 07/29/2015   Procedure: Left Lumbar two-three microdiscectomy;  Surgeon: Loura HaltBenjamin Jared Ditty, MD;  Location: [facility];  Service: Neurosurgery;  Laterality: Left;   Social History:  reports that he has been smoking Cigarettes.  He started smoking about 54 years ago. He has been smoking about 1.00 pack per day. He has never used smokeless tobacco. He reports that he does not drink alcohol or use drugs.   Family History  Problem Relation Age of Onset  . Diabetes Mother   . Hypertension Mother   . Diabetes Father   . Heart disease Father   . Hypertension Father   . Diabetes Sister   . Hypertension Sister   . Diabetes Brother   . Heart disease Brother   . Hypertension Brother   . Diabetes Brother   . Hypertension Brother   . Diabetes Brother   . Heart disease Brother   . Kidney disease Brother   . Hypertension Brother   . Cancer Neg Hx      Allergies  Allergen Reactions  . Sulfasalazine Rash     Prior to Admission medications   Medication Sig Start Date End Date Taking? Authorizing Provider  albuterol (PROVENTIL HFA;VENTOLIN HFA) 108 (90 Base) MCG/ACT inhaler Inhale 2 puffs into the lungs 4 (four) times daily as needed. Patient taking differently: Inhale 2 puffs into the lungs 4 (four) times daily as needed for wheezing or shortness of breath.  12/09/15  Yes Karie Schwalbeichard I Letvak, MD  aspirin 81 MG tablet Take 81 mg by mouth every evening.    Yes Historical Provider, MD  atorvastatin (LIPITOR) 10 MG tablet Take 10 mg by mouth daily at 6 PM.    Yes Historical Provider, MD  BD ULTRA-FINE LANCETS lancets 1 each by Other route. Use as instructed   Yes Historical Provider, MD  Cholecalciferol (VITAMIN D-3) 1000 UNITS CAPS Take 1,000 Units by mouth  daily.    Yes Historical Provider, MD  docusate sodium (COLACE) 100 MG capsule Take 1 capsule (100 mg total) by mouth daily as needed for mild constipation. 07/30/15  Yes Rolly SalterPranav M Patel, MD  flunisolide (NASAREL) 29 MCG/ACT (0.025%) nasal spray Place 2 sprays into the nose 2 (two) times daily. Dose is for each nostril.   Yes Historical Provider, MD  furosemide (LASIX) 40 MG tablet Take 40 mg by mouth daily as needed for fluid or edema.    Yes Historical Provider, MD  glipiZIDE (GLUCOTROL) 10 MG tablet Take 10 mg by mouth 2 (two) times daily before a meal.   Yes Historical Provider, MD  glucose blood (PRECISION XTRA TEST STRIPS) test strip 1 each by Other route daily. Use as instructed    Yes Historical Provider, MD  hydrocerin (EUCERIN) CREA Apply 1 application topically 2 (two) times daily as needed (dry skin).    Yes Historical Provider, MD  lisinopril (PRINIVIL,ZESTRIL) 10 MG tablet Take 1 tablet (10 mg total) by mouth daily. Patient taking differently: Take 5 mg by mouth daily.  08/11/15  Yes Karie Schwalbeichard I Letvak, MD  loratadine (CLARITIN) 10 MG tablet Take 10 mg by mouth daily.   Yes Historical Provider, MD  magnesium oxide (MAG-OX)  400 MG tablet Take 420 mg by mouth daily.   Yes Historical Provider, MD  metFORMIN (GLUCOPHAGE) 1000 MG tablet Take 1 tablet (1,000 mg total) by mouth 2 (two) times daily with a meal. 08/11/15  Yes Karie Schwalbe, MD  metoprolol (LOPRESSOR) 50 MG tablet Take 50 mg by mouth every morning.    Yes Historical Provider, MD  omeprazole (PRILOSEC) 20 MG capsule Take 20 mg by mouth daily.   Yes Historical Provider, MD  potassium chloride (MICRO-K) 10 MEQ CR capsule Take 10 mEq by mouth daily.   Yes Historical Provider, MD  ranitidine (ZANTAC) 150 MG capsule Take 75 mg by mouth 2 (two) times daily.    Yes Historical Provider, MD  tiotropium (SPIRIVA) 18 MCG inhalation capsule Place 18 mcg into inhaler and inhale every morning.   Yes Historical Provider, MD  polyethylene glycol  (MIRALAX / GLYCOLAX) packet Take 17 g by mouth daily. Patient not taking: Reported on 2016/06/10 07/30/15   Rolly Salter, MD    Review of Systems:  Constitutional:  No weight loss, night sweats, Fevers, chills, fatigue.  Head&Eyes: No headache.  No vision loss.  No eye pain or scotoma ENT:  No Difficulty swallowing,Tooth/dental problems,Sore throat,  No ear ache, post nasal drip,  Cardio-vascular:  No  Orthopnea, PND,   dizziness, palpitations  GI:  No   diarrhea, loss of appetite, hematochezia, melena, heartburn, indigestion, Resp:  No coughing up of blood .No wheezing.No chest wall deformity  Skin:  no rash or lesions.  GU:  no dysuria, change in color of urine, no urgency or frequency. No flank pain.  Musculoskeletal:  No joint pain or swelling. No decreased range of motion. No back pain.  Psych:  No change in mood or affect. No depression or anxiety. Neurologic: No headache, no dysesthesia, no focal weakness, no vision loss. No syncope  Physical Exam: Vitals:   2016/06/10 0240 2016-06-10 0313 2016-06-10 0546 2016/06/10 0726  BP: 100/88 102/77 101/75 102/79  Pulse: 94 101 98 82  Resp: 24 17 25 21   SpO2: 100% 100% 100% 99%   General:  A&O x 3, NAD, nontoxic, pleasant/cooperative Head/Eye: No conjunctival hemorrhage, no icterus, [place]/AT, No nystagmus ENT:  No icterus,  No thrush, good dentition, no pharyngeal exudate Neck:  No masses, no lymphadenpathy, no bruits CV:  RRR, no rub, no gallop, no S3+ JVD Lung:  Bilateral crackles, good air movement, no wheeze, no rhonchi Abdomen: soft/periumbilical tender without guarding, +BS, nondistended, no peritoneal signs Ext: No cyanosis, No rashes, No petechiae, No lymphangitis, 2 + LE edema Neuro: CNII-XII intact, strength 4/5 in bilateral upper and lower extremities, no dysmetria  Labs on Admission:  Basic Metabolic Panel:  Recent Labs Lab 2016-06-10 0045  NA 126*  K 5.7*  CL 92*  CO2 19*  GLUCOSE 63*  BUN 72*  CREATININE 2.31*   CALCIUM 10.5*   Liver Function Tests:  Recent Labs Lab 06-10-16 0045  AST 2,242*  ALT 2,232*  ALKPHOS 74  BILITOT 2.6*  PROT 7.8  ALBUMIN 4.1    Recent Labs Lab 2016/06/10 0045  LIPASE 41   No results for input(s): AMMONIA in the last 168 hours. CBC:  Recent Labs Lab 10-Jun-2016 0045  WBC 12.3*  NEUTROABS 9.6*  HGB 12.7*  HCT 36.5*  MCV 94.3  PLT 144*   Coagulation Profile:  Recent Labs Lab 06-10-2016 0650  INR 1.79   Cardiac Enzymes: No results for input(s): CKTOTAL, CKMB, CKMBINDEX, TROPONINI in the last 168 hours.  BNP: Invalid input(s): POCBNP CBG: No results for input(s): GLUCAP in the last 168 hours. Urine analysis:    Component Value Date/Time   COLORURINE AMBER (A) 12-May-2016 0150   APPEARANCEUR HAZY (A) 05/12/2016 0150   LABSPEC 1.019 2016/05/12 0150   PHURINE 5.0 May 12, 2016 0150   GLUCOSEU NEGATIVE 05-12-16 0150   HGBUR MODERATE (A) 2016/05/12 0150   BILIRUBINUR NEGATIVE 05-12-2016 0150   KETONESUR NEGATIVE 05/12/2016 0150   PROTEINUR 100 (A) May 12, 2016 0150   NITRITE NEGATIVE May 12, 2016 0150   LEUKOCYTESUR NEGATIVE 12-May-2016 0150   Sepsis Labs: @LABRCNTIP (procalcitonin:4,lacticidven:4) ) Recent Results (from the past 240 hour(s))  Culture, blood (Routine X 2) w Reflex to ID Panel     Status: None (Preliminary result)   Collection Time: 12-May-2016  7:15 AM  Result Value Ref Range Status   Specimen Description   Final    BLOOD WRIST Performed at [facility], [address], [place], [place] [address]    Special Requests BOTTLES DRAWN AEROBIC AND ANAEROBIC 10CC  Final   Culture PENDING  Incomplete   Report Status PENDING  Incomplete     Radiological Exams on Admission: Ct Abdomen Pelvis Wo Contrast  Result Date: 05/12/16 CLINICAL DATA:  71 year old male with abdominal pain and microhematuria. No bowel movements for several days. EXAM: CT ABDOMEN AND PELVIS WITHOUT CONTRAST TECHNIQUE: Multidetector CT imaging of the abdomen  and pelvis was performed following the standard protocol without IV contrast. COMPARISON:  Abdominal CT dated 07/28/2015 FINDINGS: Evaluation of this exam is limited in the absence of intravenous contrast. Lower chest: There is moderate cardiomegaly with multi vessel coronary vascular disease. There is hypoattenuation of the cardiac blood pool suggestive of a degree of anemia. Clinical correlation is recommended. Mild interlobular septal prominence and partially visualized small right pleural effusion likely representing mild interstitial edema. 1 No intra-abdominal free air. Diffuse mesenteric stranding and small ascites. Hepatobiliary: There is apparent minimal irregularity of the hepatic contour with enlargement of the left lobe of the liver concerning for early changes of cirrhosis. Correlation with clinical exam and LFTs recommended. No intrahepatic biliary ductal dilatation. High attenuating content within the gallbladder may represent sludge or stones versus vicarious excretion of contrast from recent intravenous injection. Pancreas: Unremarkable. No pancreatic ductal dilatation or surrounding inflammatory changes. Spleen: Normal in size without focal abnormality. Adrenals/Urinary Tract: A 1.7 x 2.4 cm left adrenal low attenuating nodule with fat attenuation most consistent with an adenoma. A smaller right adrenal adenoma versus thickening noted. There is a 7 mm nonobstructing right renal interpolar calculus. No hydronephrosis. There is mild left renal parenchymal atrophy. There is no hydronephrosis or nephrolithiasis on the left. The visualized ureters appear unremarkable. The urinary bladder is only partially distended and appears grossly unremarkable but Stomach/Bowel: There is sigmoid diverticulosis without active inflammatory changes. There is no evidence of bowel obstruction or active inflammation. Normal appendix. Vascular/Lymphatic: Advanced aortoiliac atherosclerotic disease. The IVC is grossly  unremarkable. No portal venous gas identified. Evaluation of the vasculature is limited on this noncontrast study. There is atherosclerotic calcification of the origin and mid portion of the SMA. There is no adenopathy. Reproductive: The prostate and seminal vesicles are grossly unremarkable. Other: Diffuse subcutaneous edema and anasarca. No fluid collection. There is a small fat containing left inguinal hernia with extension of small amount of ascitic fluid in the left inguinal canal. Musculoskeletal: Degenerative changes of the spine. Bilateral L5 pars defects with grade II L5-S1 anterolisthesis. L2-L3 disc desiccation with vacuum phenomena. Endplate irregularity and  disc space narrowing at L2-L3. There has been interval microdiskectomy with removal of the previously seen extruded disc fragment at L2-L3. No acute fracture. IMPRESSION: 1. Enlargement of the left lobe of the liver concerning for early changes of cirrhosis. Correlation with clinical exam and LFTs recommended. 2. Gallbladder sludge versus small stones versus vicarious excretion of intravenous contrast. 3. A 7 mm nonobstructing right renal stone. No hydronephrosis or obstructing calculus. 4. Small ascites, diffuse mesenteric edema, and anasarca. 5. Sigmoid diverticulosis. No evidence of bowel obstruction or active inflammation. Normal appendix. 6. Aortoiliac atherosclerotic disease. 7. Cardiomegaly with multi vessel coronary vascular disease. 8. Partially visualized small right pleural effusion with findings concerning for mild interstitial edema. 9. Multilevel degenerative changes of the spine. There has been interval microdiskectomy with removal of extruded disc fragment at L2-3. 10. Grade 2 L5-S1 anterolisthesis with associated bilateral neural foramina stenosis. Electronically Signed   By: Elgie Collard M.D.   On: 2016-05-14 05:53    EKG: Independently reviewed. Sinus with nonspecific T wave changes    Time spent:60 minutes Code Status:    FULL--verified with patient Family Communication:  Daughter updated at bedside Disposition Plan: expect 3-4 day hospitalization Consults called: none DVT Prophylaxis: [place] Lovenox  Kourtney Terriquez, DO  [facility] Pager [phone]  If 7PM-7AM, please contact night-coverage [email or web address] Password [facility] 05-14-16, 9:53 AM

## 2016-05-11 NOTE — ED Triage Notes (Signed)
Pt comes to ed with [facility], c/o lower epigastric pain 8 out 10. Pt has not had a bowel movement in 2 days/ no nausea or vomiting. Pt took miralax and xlax with little relief. Pt has COPD, CHF, HTN. Diabetes. V/s on arrival are bp 110/74, hr 95, rr20, 98 per cent on 2 liters. 30 percent on ejection fraction. Pt verbalizes to EMS, that has been diagnosed with cardiac condition. Do not know the size of it.  Alert and orientated. Sinus rhythm on monitor. Pt comes from home [address], [place] [phone]. Family on the way.

## 2016-05-11 NOTE — ED Notes (Signed)
Bed: WA07 Expected date:  Expected time:  Means of arrival:  Comments: EMS 

## 2016-05-11 NOTE — ED Notes (Signed)
Provider notified troponin 0.08

## 2016-05-11 NOTE — ED Notes (Signed)
Vascular US at bedside.

## 2016-05-11 NOTE — ED Notes (Signed)
Pt in ct 

## 2016-05-11 NOTE — ED Notes (Signed)
20 is above wrist

## 2016-05-11 NOTE — ED Provider Notes (Signed)
[facility]-EMERGENCY DEPT Provider Note   CSN: [id number] Arrival date & time: May 18, 2016  0017  By signing my name below, I, Teofilo Pod, attest that this documentation has been prepared under the direction and in the presence of Gilda Crease, MD . Electronically Signed: Teofilo Pod, ED Scribe. 05-18-16. 6:16 AM.    History   Chief Complaint Chief Complaint  Patient presents with  . Abdominal Pain    possiable constipation lower bowel tender    The history is provided by the patient. No language interpreter was used.   HPI Comments:  Howard Dixon is a 71 y.o. male with PMHx of CAD, COPD, DM and HTN who presents to the Emergency Department complaining of constant abdominal pain since yesterday. Pt complains of associated intermittent chest tightness, 1 episode of vomiting. Pt states that the chest tightness is a chronic problem. Pt states that he has not had a BM in several days. Pt had to have a cardiac catheterization because he did not pass a stress test. Pt had a CT of chest for cancer screening and a 5cm aortic aneurysm was found, then pt went to [facility] and was told the he was a poor surgical candidate to regulate BP control. No alleviating factors noted. Pt denies urinary symptoms, diarrhea.   Past Medical History:  Diagnosis Date  . Allergic rhinitis due to pollen   . COPD (chronic obstructive pulmonary disease) (HCC)   . Coronary artery disease, occlusive   . GERD (gastroesophageal reflux disease)   . Hyperlipidemia   . Hypertension   . Type II or unspecified type diabetes mellitus with neurological manifestations, not stated as uncontrolled(250.60)     Patient Active Problem List   Diagnosis Date Noted  . Chronic diastolic heart failure (HCC) 03/10/2016  . Aortic stenosis 08/06/2015  . Herniated nucleus pulposis of lumbosacral region 07/28/2015  . 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 Spinal stenosis 07/28/2015  . Low back pain  07/28/2015  . Nicotine dependence 04/05/2012  . Allergic rhinitis due to pollen   . Coronary artery disease, occlusive   . Hyperlipidemia   . Hypertension   . Type 2 diabetes, controlled, with neuropathy (HCC)   . GERD (gastroesophageal reflux disease)   . COPD (chronic obstructive pulmonary disease) (HCC)     Past Surgical History:  Procedure Laterality Date  . BACK SURGERY    . HERNIA REPAIR    . LUMBAR LAMINECTOMY/DECOMPRESSION MICRODISCECTOMY Left 07/29/2015   Procedure: Left Lumbar two-three microdiscectomy;  Surgeon: Loura Halt Ditty, MD;  Location: [facility];  Service: Neurosurgery;  Laterality: Left;       Home Medications    Prior to Admission medications   Medication Sig Start Date End Date Taking? Authorizing Provider  albuterol (PROVENTIL HFA;VENTOLIN HFA) 108 (90 Base) MCG/ACT inhaler Inhale 2 puffs into the lungs 4 (four) times daily as needed. Patient taking differently: Inhale 2 puffs into the lungs 4 (four) times daily as needed for wheezing or shortness of breath.  12/09/15  Yes Karie Schwalbe, MD  aspirin 81 MG tablet Take 81 mg by mouth every evening.    Yes Historical Provider, MD  atorvastatin (LIPITOR) 10 MG tablet Take 10 mg by mouth daily at 6 PM.    Yes Historical Provider, MD  BD ULTRA-FINE LANCETS lancets 1 each by Other route. Use as instructed   Yes Historical Provider, MD  Cholecalciferol (VITAMIN D-3) 1000 UNITS CAPS Take 1,000 Units by mouth daily.  Yes Historical Provider, MD  docusate sodium (COLACE) 100 MG capsule Take 1 capsule (100 mg total) by mouth daily as needed for mild constipation. 07/30/15  Yes Rolly Salter, MD  flunisolide (NASAREL) 29 MCG/ACT (0.025%) nasal spray Place 2 sprays into the nose 2 (two) times daily. Dose is for each nostril.   Yes Historical Provider, MD  furosemide (LASIX) 40 MG tablet Take 40 mg by mouth daily as needed for fluid or edema.    Yes Historical Provider, MD  glipiZIDE (GLUCOTROL) 10 MG tablet Take  10 mg by mouth 2 (two) times daily before a meal.   Yes Historical Provider, MD  glucose blood (PRECISION XTRA TEST STRIPS) test strip 1 each by Other route daily. Use as instructed    Yes Historical Provider, MD  hydrocerin (EUCERIN) CREA Apply 1 application topically 2 (two) times daily as needed (dry skin).    Yes Historical Provider, MD  lisinopril (PRINIVIL,ZESTRIL) 10 MG tablet Take 1 tablet (10 mg total) by mouth daily. Patient taking differently: Take 5 mg by mouth daily.  08/11/15  Yes Karie Schwalbe, MD  loratadine (CLARITIN) 10 MG tablet Take 10 mg by mouth daily.   Yes Historical Provider, MD  magnesium oxide (MAG-OX) 400 MG tablet Take 420 mg by mouth daily.   Yes Historical Provider, MD  metFORMIN (GLUCOPHAGE) 1000 MG tablet Take 1 tablet (1,000 mg total) by mouth 2 (two) times daily with a meal. 08/11/15  Yes Karie Schwalbe, MD  metoprolol (LOPRESSOR) 50 MG tablet Take 50 mg by mouth every morning.    Yes Historical Provider, MD  omeprazole (PRILOSEC) 20 MG capsule Take 20 mg by mouth daily.   Yes Historical Provider, MD  potassium chloride (MICRO-K) 10 MEQ CR capsule Take 10 mEq by mouth daily.   Yes Historical Provider, MD  ranitidine (ZANTAC) 150 MG capsule Take 75 mg by mouth 2 (two) times daily.    Yes Historical Provider, MD  tiotropium (SPIRIVA) 18 MCG inhalation capsule Place 18 mcg into inhaler and inhale every morning.   Yes Historical Provider, MD  polyethylene glycol (MIRALAX / GLYCOLAX) packet Take 17 g by mouth daily. Patient not taking: Reported on 05/07/2016 07/30/15   Rolly Salter, MD    Family History Family History  Problem Relation Age of Onset  . Diabetes Mother   . Hypertension Mother   . Diabetes Father   . Heart disease Father   . Hypertension Father   . Diabetes Sister   . Hypertension Sister   . Diabetes Brother   . Heart disease Brother   . Hypertension Brother   . Diabetes Brother   . Hypertension Brother   . Diabetes Brother   . Heart  disease Brother   . Kidney disease Brother   . Hypertension Brother   . Cancer Neg Hx     Social History Social History  Substance Use Topics  . Smoking status: Current Every Day Smoker    Packs/day: 1.00    Types: Cigarettes    Start date: 03/22/1962  . Smokeless tobacco: Never Used     Comment: Just not willing to stop  . Alcohol use No     Allergies   Sulfasalazine   Review of Systems Review of Systems  Respiratory: Positive for chest tightness.   Gastrointestinal: Positive for abdominal pain, constipation and vomiting. Negative for diarrhea.  Genitourinary: Negative for dysuria.  All other systems reviewed and are negative.    Physical Exam Updated Vital Signs  BP 101/75 (BP Location: Left Arm)   Pulse 98   Resp 25   SpO2 100%   Physical Exam  Constitutional: He is oriented to person, place, and time. He appears well-developed and well-nourished. No distress.  HENT:  Head: Normocephalic and atraumatic.  Right Ear: Hearing normal.  Left Ear: Hearing normal.  Nose: Nose normal.  Mouth/Throat: Oropharynx is clear and moist and mucous membranes are normal.  Eyes: Conjunctivae and EOM are normal. Pupils are equal, round, and reactive to light.  Neck: Normal range of motion. Neck supple.  Cardiovascular: Regular rhythm, S1 normal and S2 normal.  Exam reveals no gallop and no friction rub.   No murmur heard. Pulmonary/Chest: Effort normal and breath sounds normal. No respiratory distress. He exhibits no tenderness.  Abdominal: Soft. Normal appearance and bowel sounds are normal. There is no hepatosplenomegaly. There is tenderness (Periumbilical). There is no rebound, no guarding, no tenderness at McBurney's point and negative Murphy's sign. No hernia.  Musculoskeletal: Normal range of motion.  Neurological: He is alert and oriented to person, place, and time. He has normal strength. No cranial nerve deficit or sensory deficit. Coordination normal. GCS eye subscore is  4. GCS verbal subscore is 5. GCS motor subscore is 6.  Skin: Skin is warm, dry and intact. No rash noted. No cyanosis.  Psychiatric: He has a normal mood and affect. His speech is normal and behavior is normal. Thought content normal.  Nursing note and vitals reviewed.    ED Treatments / Results  DIAGNOSTIC STUDIES:  Oxygen Saturation is 96% on NCO2, normal by my interpretation.    COORDINATION OF CARE:  12:39 AM  Discussed treatment plan with pt at bedside and pt agreed to plan.   Labs (all labs ordered are listed, but only abnormal results are displayed) Labs Reviewed  CBC WITH DIFFERENTIAL/PLATELET - Abnormal; Notable for the following:       Result Value   WBC 12.3 (*)    RBC 3.87 (*)    Hemoglobin 12.7 (*)    HCT 36.5 (*)    Platelets 144 (*)    Neutro Abs 9.6 (*)    All other components within normal limits  COMPREHENSIVE METABOLIC PANEL - Abnormal; Notable for the following:    Sodium 126 (*)    Potassium 5.7 (*)    Chloride 92 (*)    CO2 19 (*)    Glucose, Bld 63 (*)    BUN 72 (*)    Creatinine, Ser 2.31 (*)    Calcium 10.5 (*)    AST 2,242 (*)    ALT 2,232 (*)    Total Bilirubin 2.6 (*)    GFR calc non Af Amer 27 (*)    GFR calc Af Amer 31 (*)    All other components within normal limits  URINALYSIS, ROUTINE W REFLEX MICROSCOPIC - Abnormal; Notable for the following:    Color, Urine AMBER (*)    APPearance HAZY (*)    Hgb urine dipstick MODERATE (*)    Protein, ur 100 (*)    All other components within normal limits  LIPASE, BLOOD  HEPATITIS PANEL, ACUTE  ACETAMINOPHEN LEVEL  I-STAT TROPOININ, ED    EKG  EKG Interpretation  Date/Time:  Tuesday May 11 2016 00:25:24 EST Ventricular Rate:  91 PR Interval:    QRS Duration: 111 QT Interval:  378 QTC Calculation: 466 R Axis:   143 Text Interpretation:  Age not entered, assumed to be  71 years old for purpose  of ECG interpretation Sinus rhythm Abnormal lateral Q waves Anterior infarct, old  Baseline wander in lead(s) V4 Confirmed by POLLINA  MD, CHRISTOPHER [phone]) on 05/01/2016 12:53:36 AM       Radiology Ct Abdomen Pelvis Wo Contrast  Result Date: 04/22/2016 CLINICAL DATA:  71 year old male with abdominal pain and microhematuria. No bowel movements for several days. EXAM: CT ABDOMEN AND PELVIS WITHOUT CONTRAST TECHNIQUE: Multidetector CT imaging of the abdomen and pelvis was performed following the standard protocol without IV contrast. COMPARISON:  Abdominal CT dated 07/28/2015 FINDINGS: Evaluation of this exam is limited in the absence of intravenous contrast. Lower chest: There is moderate cardiomegaly with multi vessel coronary vascular disease. There is hypoattenuation of the cardiac blood pool suggestive of a degree of anemia. Clinical correlation is recommended. Mild interlobular septal prominence and partially visualized small right pleural effusion likely representing mild interstitial edema. 1 No intra-abdominal free air. Diffuse mesenteric stranding and small ascites. Hepatobiliary: There is apparent minimal irregularity of the hepatic contour with enlargement of the left lobe of the liver concerning for early changes of cirrhosis. Correlation with clinical exam and LFTs recommended. No intrahepatic biliary ductal dilatation. High attenuating content within the gallbladder may represent sludge or stones versus vicarious excretion of contrast from recent intravenous injection. Pancreas: Unremarkable. No pancreatic ductal dilatation or surrounding inflammatory changes. Spleen: Normal in size without focal abnormality. Adrenals/Urinary Tract: A 1.7 x 2.4 cm left adrenal low attenuating nodule with fat attenuation most consistent with an adenoma. A smaller right adrenal adenoma versus thickening noted. There is a 7 mm nonobstructing right renal interpolar calculus. No hydronephrosis. There is mild left renal parenchymal atrophy. There is no hydronephrosis or nephrolithiasis on the left.  The visualized ureters appear unremarkable. The urinary bladder is only partially distended and appears grossly unremarkable but Stomach/Bowel: There is sigmoid diverticulosis without active inflammatory changes. There is no evidence of bowel obstruction or active inflammation. Normal appendix. Vascular/Lymphatic: Advanced aortoiliac atherosclerotic disease. The IVC is grossly unremarkable. No portal venous gas identified. Evaluation of the vasculature is limited on this noncontrast study. There is atherosclerotic calcification of the origin and mid portion of the SMA. There is no adenopathy. Reproductive: The prostate and seminal vesicles are grossly unremarkable. Other: Diffuse subcutaneous edema and anasarca. No fluid collection. There is a small fat containing left inguinal hernia with extension of small amount of ascitic fluid in the left inguinal canal. Musculoskeletal: Degenerative changes of the spine. Bilateral L5 pars defects with grade II L5-S1 anterolisthesis. L2-L3 disc desiccation with vacuum phenomena. Endplate irregularity and disc space narrowing at L2-L3. There has been interval microdiskectomy with removal of the previously seen extruded disc fragment at L2-L3. No acute fracture. IMPRESSION: 1. Enlargement of the left lobe of the liver concerning for early changes of cirrhosis. Correlation with clinical exam and LFTs recommended. 2. Gallbladder sludge versus small stones versus vicarious excretion of intravenous contrast. 3. A 7 mm nonobstructing right renal stone. No hydronephrosis or obstructing calculus. 4. Small ascites, diffuse mesenteric edema, and anasarca. 5. Sigmoid diverticulosis. No evidence of bowel obstruction or active inflammation. Normal appendix. 6. Aortoiliac atherosclerotic disease. 7. Cardiomegaly with multi vessel coronary vascular disease. 8. Partially visualized small right pleural effusion with findings concerning for mild interstitial edema. 9. Multilevel degenerative  changes of the spine. There has been interval microdiskectomy with removal of extruded disc fragment at L2-3. 10. Grade 2 L5-S1 anterolisthesis with associated bilateral neural foramina stenosis. Electronically Signed   By: Elgie Collard M.D.   On:  05-24-16 05:53    Procedures Procedures (including critical care time)  Medications Ordered in ED Medications  iopamidol (ISOVUE-300) 61 % injection 30 mL (30 mLs Oral Contrast Given 05/24/2016 0310)  morphine 4 MG/ML injection 4 mg (4 mg Intravenous Given 24-May-2016 0441)  ondansetron (ZOFRAN) injection 4 mg (4 mg Intravenous Given May 24, 2016 0444)     Initial Impression / Assessment and Plan / ED Course  I have reviewed the triage vital signs and the nursing notes.  Pertinent labs & imaging results that were available during my care of the patient were reviewed by me and considered in my medical decision making (see chart for details).     Patient with previous history of coronary artery disease, congestive heart failure, recently diagnosed incidental ascending aortic aneurysm presents to the emergency department with complaints of chest pain and abdominal pain. He reports that he has been experiencing intermittent chest pain for "sometime". He has not identified alleviating or exacerbating factors of the chest pain. It can happen when he is at rest. He has noticed increasing shortness of breath. In the last couple of days, however, he has been experiencing constant abdominal pain. This has been associated with nausea and vomiting.  EKG does not show obvious ischemia or infarct at arrival. Troponin 0.08 (high normal). Lab work revealed slight leukocytosis. He has hyponatremia and evidence of acute kidney injury. He is on diuretics for his heart failure. Patient found to have significantly elevated transaminases with normal bilirubin and alkaline phosphatase. He reports that he drank 30 years ago, no alcohol since. He denies any pain medications  including Tylenol. CT scan shows irregularity of the hepatic contour concerning for early cirrhosis. No ductal dilatation. There is evidence of small ascites, diffuse mesenteric edema and anasarca. There is also evidence of small pleural effusions and interstitial edema in the lungs. Patient is experiencing some shortness of breath, oxygen is normal on supplemental nasal cannula. He is likely experiencing some third spacing from acute liver disease. AKI raises concern for hepatorenal syndrome. Patient will require hospitalization for further management.  Final Clinical Impressions(s) / ED Diagnoses   Final diagnoses:  Hepatitis  Chest pain, unspecified type  Hyponatremia  AKI (acute kidney injury) (HCC)    New Prescriptions New Prescriptions   No medications on file  I personally performed the services described in this documentation, which was scribed in my presence. The recorded information has been reviewed and is accurate.     Gilda Crease, MD 24-May-2016 [phone]

## 2016-05-11 NOTE — ED Notes (Signed)
Pt verbalizes he has a cardiac aneurism.[facility]. Dr. Romeo Appleonn.

## 2016-05-11 NOTE — Progress Notes (Signed)
CRITICAL VALUE ALERT  Critical value received:  Troponin 0.17  Date of notification:  02/07/2017  Time of notification:  No call from lab RN saw result  Critical value read back:No.  Nurse who received alert:  Driscilla MoatsBriana Mattis Featherly, RN  MD notified (1st page):  Dr. Arbutus Leasat  Time of first page:  [phone]  MD notified (2nd page):  Time of second page:  Responding MD:   Time MD responded:      MD aware of results

## 2016-05-11 NOTE — Progress Notes (Signed)
VASCULAR LAB PRELIMINARY  PRELIMINARY  PRELIMINARY  PRELIMINARY  Bilateral lower extremity venous duplex completed.    Preliminary report:  Bilateral:  No evidence of DVT, superficial thrombosis, or Baker's Cyst.   Shizuo Biskup, RVS 03/17/2017, 2:02 PM

## 2016-05-11 NOTE — ED Notes (Signed)
US at bedside

## 2016-05-12 ENCOUNTER — Inpatient Hospital Stay (HOSPITAL_COMMUNITY): Payer: Non-veteran care

## 2016-05-12 DIAGNOSIS — I5043 Acute on chronic combined systolic (congestive) and diastolic (congestive) heart failure: Secondary | ICD-10-CM

## 2016-05-12 DIAGNOSIS — I509 Heart failure, unspecified: Secondary | ICD-10-CM

## 2016-05-12 DIAGNOSIS — I10 Essential (primary) hypertension: Secondary | ICD-10-CM

## 2016-05-12 DIAGNOSIS — E875 Hyperkalemia: Secondary | ICD-10-CM

## 2016-05-12 DIAGNOSIS — R0789 Other chest pain: Secondary | ICD-10-CM

## 2016-05-12 LAB — GLUCOSE, CAPILLARY
GLUCOSE-CAPILLARY: 142 mg/dL — AB (ref 65–99)
GLUCOSE-CAPILLARY: 193 mg/dL — AB (ref 65–99)
Glucose-Capillary: 104 mg/dL — ABNORMAL HIGH (ref 65–99)
Glucose-Capillary: 192 mg/dL — ABNORMAL HIGH (ref 65–99)
Glucose-Capillary: 222 mg/dL — ABNORMAL HIGH (ref 65–99)

## 2016-05-12 LAB — BLOOD CULTURE ID PANEL (REFLEXED)
ACINETOBACTER BAUMANNII: NOT DETECTED
CANDIDA KRUSEI: NOT DETECTED
Candida albicans: NOT DETECTED
Candida glabrata: NOT DETECTED
Candida parapsilosis: NOT DETECTED
Candida tropicalis: NOT DETECTED
ENTEROBACTER CLOACAE COMPLEX: NOT DETECTED
ESCHERICHIA COLI: NOT DETECTED
Enterobacteriaceae species: NOT DETECTED
Enterococcus species: NOT DETECTED
Haemophilus influenzae: NOT DETECTED
Klebsiella oxytoca: NOT DETECTED
Klebsiella pneumoniae: NOT DETECTED
LISTERIA MONOCYTOGENES: NOT DETECTED
Methicillin resistance: NOT DETECTED
Neisseria meningitidis: NOT DETECTED
PSEUDOMONAS AERUGINOSA: NOT DETECTED
Proteus species: NOT DETECTED
SERRATIA MARCESCENS: NOT DETECTED
STAPHYLOCOCCUS AUREUS BCID: NOT DETECTED
STREPTOCOCCUS AGALACTIAE: NOT DETECTED
STREPTOCOCCUS PNEUMONIAE: NOT DETECTED
Staphylococcus species: DETECTED — AB
Streptococcus pyogenes: NOT DETECTED
Streptococcus species: NOT DETECTED

## 2016-05-12 LAB — HEPATITIS PANEL, ACUTE
HCV AB: 0.1 {s_co_ratio} (ref 0.0–0.9)
HEP B C IGM: NEGATIVE
HEP B S AG: NEGATIVE
Hep A IgM: NEGATIVE

## 2016-05-12 LAB — ECHOCARDIOGRAM COMPLETE
Height: 66 in
Weight: 2723.12 oz

## 2016-05-12 LAB — BASIC METABOLIC PANEL
ANION GAP: 11 (ref 5–15)
BUN: 67 mg/dL — ABNORMAL HIGH (ref 6–20)
CALCIUM: 9.3 mg/dL (ref 8.9–10.3)
CHLORIDE: 94 mmol/L — AB (ref 101–111)
CO2: 25 mmol/L (ref 22–32)
Creatinine, Ser: 2.08 mg/dL — ABNORMAL HIGH (ref 0.61–1.24)
GFR calc non Af Amer: 31 mL/min — ABNORMAL LOW (ref >60)
GFR, EST AFRICAN AMERICAN: 36 mL/min — AB (ref >60)
Glucose, Bld: 150 mg/dL — ABNORMAL HIGH (ref 65–99)
POTASSIUM: 3.9 mmol/L (ref 3.5–5.1)
Sodium: 130 mmol/L — ABNORMAL LOW (ref 135–145)

## 2016-05-12 LAB — HEMOGLOBIN A1C
Hgb A1c MFr Bld: 6.5 % — ABNORMAL HIGH (ref 4.8–5.6)
Mean Plasma Glucose: 140

## 2016-05-12 MED ORDER — PERFLUTREN LIPID MICROSPHERE
1.0000 mL | INTRAVENOUS | Status: DC | PRN
Start: 2016-05-12 — End: 2016-05-12
  Administered 2016-05-12: 2 mL via INTRAVENOUS
  Filled 2016-05-12: qty 10

## 2016-05-12 MED ORDER — PERFLUTREN LIPID MICROSPHERE
INTRAVENOUS | Status: AC
  Filled 2016-05-12: qty 10

## 2016-05-12 NOTE — Evaluation (Signed)
Physical Therapy Evaluation Patient Details Name: Howard RidgeJohn W Dimmer MRN: [id number] DOB: 09-13-1945 Today's Date: 05/12/2016   History of Present Illness  71 yo male admitted with acute hepatitis, CHF, atypical chest pain with elevated troponin. Hx of CHF, CAD, COPD, DM, aortic aneurysm, L2-L3 microdiscetomy 07/2015  Clinical Impression  On eval, pt required Min assist for mobility. He walked ~60 feet in hallway without a device. He is unsteady and generally weak. O2 sats 95% on RA at rest, 88% on RA during ambulation. Dyspnea 2/4, HR 130 bpm during ambulation. Will follow and progress activity as tolerated. Recommend HHPT follow up at discharge. Also recommend ambulation in hallway with nursing supervision/assist to increase activity during hospital stay.    Follow Up Recommendations Home health PT    Equipment Recommendations  None recommended by PT (pt states he has access to a walker)    Recommendations for Other Services       Precautions / Restrictions Precautions Precautions: Fall Precaution Comments: monitor O2 sats Restrictions Weight Bearing Restrictions: No      Mobility  Bed Mobility Overal bed mobility: Modified Independent                Transfers Overall transfer level: Needs assistance   Transfers: Sit to/from Stand Sit to Stand: Min assist         General transfer comment: Assist to stabilize once standing. Unsteady with posterior lean initially.   Ambulation/Gait Ambulation/Gait assistance: Min assist Ambulation Distance (Feet): 60 Feet Assistive device: None Gait Pattern/deviations: Step-through pattern;Decreased stride length;Decreased step length - right;Decreased step length - left     General Gait Details: very slow gait speed. short steps bilaterally. unsteady. O2 sats 88% on RA, dyspnea 2/4, HR 130 bpm.   Stairs            Wheelchair Mobility    Modified Rankin (Stroke Patients Only)       Balance Overall balance assessment:  Needs assistance           Standing balance-Leahy Scale: Fair                               Pertinent Vitals/Pain Pain Assessment: No/denies pain    Home Living Family/patient expects to be discharged to:: Private residence Living Arrangements: Spouse/significant other Available Help at Discharge: Family Type of Home: Mobile home Home Access: Stairs to enter Entrance Stairs-Rails: Can reach both Entrance Stairs-Number of Steps: 5-6 Home Layout: One level Home Equipment: Environmental consultantWalker - 2 wheels      Prior Function Level of Independence: Independent               Hand Dominance        Extremity/Trunk Assessment   Upper Extremity Assessment Upper Extremity Assessment: Overall WFL for tasks assessed    Lower Extremity Assessment Lower Extremity Assessment: Generalized weakness    Cervical / Trunk Assessment Cervical / Trunk Assessment: Normal  Communication   Communication: No difficulties  Cognition Arousal/Alertness: Awake/alert Behavior During Therapy: WFL for tasks assessed/performed Overall Cognitive Status: Within Functional Limits for tasks assessed                      General Comments      Exercises     Assessment/Plan    PT Assessment Patient needs continued PT services  PT Problem List Decreased strength;Decreased mobility;Decreased activity tolerance;Decreased balance;Decreased knowledge of use of DME  PT Treatment Interventions DME instruction;Gait training;Therapeutic activities;Therapeutic exercise;Patient/family education;Functional mobility training    PT Goals (Current goals can be found in the Care Plan section)  Acute Rehab PT Goals Patient Stated Goal: to get stronger PT Goal Formulation: With patient Time For Goal Achievement: 05/26/16 Potential to Achieve Goals: Good    Frequency Min 3X/week   Barriers to discharge        Co-evaluation               End of Session Equipment Utilized  During Treatment: Gait belt Activity Tolerance: Patient tolerated treatment well Patient left: in chair;with call bell/phone within reach;with chair alarm set   PT Visit Diagnosis: Difficulty in walking, not elsewhere classified (R26.2)         Time: 1610-9604 PT Time Calculation (min) (ACUTE ONLY): 20 min   Charges:   PT Evaluation $PT Eval Low Complexity: 1 Procedure     PT G Codes:        Rebeca Alert, MPT Pager: [phone]

## 2016-05-12 NOTE — Progress Notes (Signed)
[facility] PROGRESS NOTE  DAYLON LAFAVOR ZOX:[id number] DOB: Feb 28, 1946 DOA: 05/13/2016 PCP: Tillman Abide, MD  Interim summary and HPI 71 y.o. male with medical history of systolic CHF with EF 25-30 percent, coronary artery disease with CTO of pLAD, COPD with continued tobacco abuse, hypertension, diabetes mellitus, descending aortic aneurysm presenting with one-week history of abdominal pain and worsening shortness of breath. The patient states that eating and activity occasionally exacerbates his abdominal pain. He felt that his furosemide, which he was started on one month ago, caused worsening abdominal pain. As a result, he stopped taking it 1 week prior to this admission. Since then, the patient has had increasing shortness of breath and coughing. The patient has had increasing lower extremity edema in the past week with 5-6 pound weight gain.  Assessment/Plan: Acute on chronic combined systolic and diastolic CHF -EF 20 percent on repeat echo; diffuse hypokinesis and grade 2 diastolic HF. -continue daily weights and strict I/O's -low sodium diet -continue IV Lasix 40 mg bid-->monitor renal function and electrolytes  Transaminasemia -suspect hepatic congestion from CHF -acetaminophen level negatve -Viral hepatitis panel negative -trend LFTs -CMV DNA -EBV DNA -d/c statin for now  Acute on chronic renal failure--CKD 3 at baseline -monitor with diuresis -holding lisinopril -due to decrease perfusion with acute CHF  Hyperkalemia -kayexalate x 1 given on admission  -continue telemetry monitoring  -d/c lisinopril and Kcl supplement -K 3.9 today  Atypical chest pain -due to CHF exacerbation -troponin elevated from demand ischemia most likely -EKG without concerning ischemic changes -continue ASA -given diffuse hypokinesis less likely to be CAD related; but because EF 20% will consult cardiology service  Hyponatremia -due to fluid overload and possible underlying  renal failure -continue diuresis -Na 130  Leukocytosis -likely stress demargination -UA negative for UTI and CXR w/o infiltrates -blood cultures x 2 sets ordered; and 1/2 demonstrated coagulase neg staph (contamination)  COPD -He didn't qualify for home oxygen, but endorses he uses his wife's oxygen prn -stable on 2 L -continue Duonebs q 8; no wheezing -will assess oxygen needs on exertion and RA  DM2 -novolog sliding scale -holding glipizide and metformin while inpatient -A1C 6.5  HTN -holding metoprolol due to soft BP -will monitor VS  Leg pain and edema -venous duplex neg -swelling secondary to CHF  Code Status: Full Family Communication: no family at bedside Disposition Plan: remains inpatient, continue IV lasix, follow strict intake and output and follow renal function.   Consultants:  None   Procedures:  LE duplex: -Preliminary report:  Bilateral:  No evidence of DVT, superficial thrombosis, or Baker's Cyst.   2-D echo: - Left ventricle: The cavity size was mildly dilated. Wall   thickness was normal. The estimated ejection fraction was 20%.   Diffuse hypokinesis. No LV thrombus noted. Features are   consistent with a pseudonormal left ventricular filling pattern,   with concomitant abnormal relaxation and increased filling   pressure (grade 2 diastolic dysfunction). - Aortic valve: Trileaflet; moderately calcified leaflets. There   was moderate stenosis. There was mild regurgitation. Mean   gradient (S): 21 mm Hg. Peak gradient (S): 32 mm Hg. Valve area   (VTI): 1.4 cm^2. - Aorta: Dilated aortic root and ascending aorta. Ascending aorta   dimension: 46 mm. Aortic root dimension: 44 mm (ED). - Mitral valve: There was no significant regurgitation. - Left atrium: The atrium was mildly dilated. - Right ventricle: Poorly visualized. The cavity size was normal.   Systolic function was mildly to moderately  reduced. - Tricuspid valve: Peak RV-RA gradient  (S): 41 mm Hg. - Pulmonary arteries: PA peak pressure: 56 mm Hg (S). - Systemic veins: IVC measured 2.6 cm with < 50% respirophasic   variation, suggesting RA pressure 15 mmHg.  Impressions: - Mildly dilated LV with EF 20%, diffuse hypokinesis. Moderate   diastolic dysfunction. Normal RV size with mild to moderately   decreased systolic function. Moderate aortic stenosis. Moderate   pulmonary hypertension. Dilated IVC suggestive of elevated RV   filling pressure.  Antibiotics:  None   HPI/Subjective: Still SOB on activity and denies CP. Reported increase urine output.  Objective: Vitals:   05/12/16 2043 05/12/16 2046  BP: (!) 81/53 101/65  Pulse: (!) 104   Resp: 18   Temp: 98.2 F (36.8 C)     Intake/Output Summary (Last 24 hours) at 05/12/16 2204 Last data filed at 05/12/16 1805  Gross per 24 hour  Intake              840 ml  Output             2800 ml  Net            -1960 ml   Filed Weights   04/29/2016 1457 05/12/16 0432  Weight: 76.7 kg (169 lb 1.5 oz) 77.2 kg (170 lb 3.1 oz)    Exam:   General: afebrile, no CP and breathing easier. Patient using Boyd oxygen supplementation (2L) and reporting feeling weak/fatigued.  Cardiovascular: mild JVD see on exam, soft SEM, no rubs, no gallops  Respiratory: fine crackles at bases bilaterally, no wheezing, scattered rhonchi  Abdomen: soft, NT, positive BS  Musculoskeletal: 1 plus edema bilaterally, no cyanosis   Data Reviewed: Basic Metabolic Panel:  Recent Labs Lab 04/25/2016 0045 05/18/2016 1255 05/12/16 0528  NA 126*  --  130*  K 5.7*  --  3.9  CL 92*  --  94*  CO2 19*  --  25  GLUCOSE 63*  --  150*  BUN 72*  --  67*  CREATININE 2.31*  --  2.08*  CALCIUM 10.5*  --  9.3  MG  --  2.5*  --    Liver Function Tests:  Recent Labs Lab 04/30/2016 0045  AST 2,242*  ALT 2,232*  ALKPHOS 74  BILITOT 2.6*  PROT 7.8  ALBUMIN 4.1    Recent Labs Lab 04/29/2016 0045  LIPASE 41   CBC:  Recent Labs Lab  05/03/2016 0045  WBC 12.3*  NEUTROABS 9.6*  HGB 12.7*  HCT 36.5*  MCV 94.3  PLT 144*   Cardiac Enzymes:  Recent Labs Lab 05/16/2016 1255 04/25/2016 1743 04/23/2016 2316  CKTOTAL 612*  --   --   TROPONINI 0.10* 0.17* 0.16*   BNP (last 3 results)  Recent Labs  03/18/16 0448  BNP 923.0*   CBG:  Recent Labs Lab 05/19/2016 1837 05/12/16 0053 05/12/16 0747 05/12/16 1207 05/12/16 1707  GLUCAP 110* 104* 142* 193* 192*    Recent Results (from the past 240 hour(s))  Culture, blood (Routine X 2) w Reflex to ID Panel     Status: None (Preliminary result)   Collection Time: 04/25/2016  6:50 AM  Result Value Ref Range Status   Specimen Description BLOOD LEFT WRIST  Final   Special Requests BOTTLES DRAWN AEROBIC AND ANAEROBIC 10CC  Final   Culture   Final    NO GROWTH 1 DAY Performed at [facility], [address] [place], [place] [address]  Report Status PENDING  Incomplete  Culture, blood (Routine X 2) w Reflex to ID Panel     Status: None (Preliminary result)   Collection Time: 05-19-16  7:15 AM  Result Value Ref Range Status   Specimen Description BLOOD WRIST  Final   Special Requests BOTTLES DRAWN AEROBIC AND ANAEROBIC 10CC  Final   Culture  Setup Time   Final    GRAM POSITIVE COCCI IN CLUSTERS AEROBIC BOTTLE ONLY CRITICAL RESULT CALLED TO, READ BACK BY AND VERIFIED WITH: Spero Curb [id number] MLM Performed at [facility], [address] [place], [place] [address]    Culture GRAM POSITIVE COCCI  Final   Report Status PENDING  Incomplete  Blood Culture ID Panel (Reflexed)     Status: Abnormal   Collection Time: May 19, 2016  7:15 AM  Result Value Ref Range Status   Enterococcus species NOT DETECTED NOT DETECTED Final   Listeria monocytogenes NOT DETECTED NOT DETECTED Final   Staphylococcus species DETECTED (A) NOT DETECTED Final    Comment: Methicillin (oxacillin) susceptible coagulase negative staphylococcus. Possible blood culture contaminant  (unless isolated from more than one blood culture draw or clinical case suggests pathogenicity). No antibiotic treatment is indicated for blood  culture contaminants. CRITICAL RESULT CALLED TO, READ BACK BY AND VERIFIED WITH: PHARMD E JACKSON [id number] MLM    Staphylococcus aureus NOT DETECTED NOT DETECTED Final   Methicillin resistance NOT DETECTED NOT DETECTED Final   Streptococcus species NOT DETECTED NOT DETECTED Final   Streptococcus agalactiae NOT DETECTED NOT DETECTED Final   Streptococcus pneumoniae NOT DETECTED NOT DETECTED Final   Streptococcus pyogenes NOT DETECTED NOT DETECTED Final   Acinetobacter baumannii NOT DETECTED NOT DETECTED Final   Enterobacteriaceae species NOT DETECTED NOT DETECTED Final   Enterobacter cloacae complex NOT DETECTED NOT DETECTED Final   Escherichia coli NOT DETECTED NOT DETECTED Final   Klebsiella oxytoca NOT DETECTED NOT DETECTED Final   Klebsiella pneumoniae NOT DETECTED NOT DETECTED Final   Proteus species NOT DETECTED NOT DETECTED Final   Serratia marcescens NOT DETECTED NOT DETECTED Final   Haemophilus influenzae NOT DETECTED NOT DETECTED Final   Neisseria meningitidis NOT DETECTED NOT DETECTED Final   Pseudomonas aeruginosa NOT DETECTED NOT DETECTED Final   Candida albicans NOT DETECTED NOT DETECTED Final   Candida glabrata NOT DETECTED NOT DETECTED Final   Candida krusei NOT DETECTED NOT DETECTED Final   Candida parapsilosis NOT DETECTED NOT DETECTED Final   Candida tropicalis NOT DETECTED NOT DETECTED Final    Comment: Performed at [facility], [address] [place], [place] [address]     Studies: Ct Abdomen Pelvis Wo Contrast  Result Date: 19-May-2016 CLINICAL DATA:  71 year old male with abdominal pain and microhematuria. No bowel movements for several days. EXAM: CT ABDOMEN AND PELVIS WITHOUT CONTRAST TECHNIQUE: Multidetector CT imaging of the abdomen and pelvis was performed following the standard protocol without IV  contrast. COMPARISON:  Abdominal CT dated 07/28/2015 FINDINGS: Evaluation of this exam is limited in the absence of intravenous contrast. Lower chest: There is moderate cardiomegaly with multi vessel coronary vascular disease. There is hypoattenuation of the cardiac blood pool suggestive of a degree of anemia. Clinical correlation is recommended. Mild interlobular septal prominence and partially visualized small right pleural effusion likely representing mild interstitial edema. 1 No intra-abdominal free air. Diffuse mesenteric stranding and small ascites. Hepatobiliary: There is apparent minimal irregularity of the hepatic contour with enlargement of the left lobe of the liver concerning  for early changes of cirrhosis. Correlation with clinical exam and LFTs recommended. No intrahepatic biliary ductal dilatation. High attenuating content within the gallbladder may represent sludge or stones versus vicarious excretion of contrast from recent intravenous injection. Pancreas: Unremarkable. No pancreatic ductal dilatation or surrounding inflammatory changes. Spleen: Normal in size without focal abnormality. Adrenals/Urinary Tract: A 1.7 x 2.4 cm left adrenal low attenuating nodule with fat attenuation most consistent with an adenoma. A smaller right adrenal adenoma versus thickening noted. There is a 7 mm nonobstructing right renal interpolar calculus. No hydronephrosis. There is mild left renal parenchymal atrophy. There is no hydronephrosis or nephrolithiasis on the left. The visualized ureters appear unremarkable. The urinary bladder is only partially distended and appears grossly unremarkable but Stomach/Bowel: There is sigmoid diverticulosis without active inflammatory changes. There is no evidence of bowel obstruction or active inflammation. Normal appendix. Vascular/Lymphatic: Advanced aortoiliac atherosclerotic disease. The IVC is grossly unremarkable. No portal venous gas identified. Evaluation of the  vasculature is limited on this noncontrast study. There is atherosclerotic calcification of the origin and mid portion of the SMA. There is no adenopathy. Reproductive: The prostate and seminal vesicles are grossly unremarkable. Other: Diffuse subcutaneous edema and anasarca. No fluid collection. There is a small fat containing left inguinal hernia with extension of small amount of ascitic fluid in the left inguinal canal. Musculoskeletal: Degenerative changes of the spine. Bilateral L5 pars defects with grade II L5-S1 anterolisthesis. L2-L3 disc desiccation with vacuum phenomena. Endplate irregularity and disc space narrowing at L2-L3. There has been interval microdiskectomy with removal of the previously seen extruded disc fragment at L2-L3. No acute fracture. IMPRESSION: 1. Enlargement of the left lobe of the liver concerning for early changes of cirrhosis. Correlation with clinical exam and LFTs recommended. 2. Gallbladder sludge versus small stones versus vicarious excretion of intravenous contrast. 3. A 7 mm nonobstructing right renal stone. No hydronephrosis or obstructing calculus. 4. Small ascites, diffuse mesenteric edema, and anasarca. 5. Sigmoid diverticulosis. No evidence of bowel obstruction or active inflammation. Normal appendix. 6. Aortoiliac atherosclerotic disease. 7. Cardiomegaly with multi vessel coronary vascular disease. 8. Partially visualized small right pleural effusion with findings concerning for mild interstitial edema. 9. Multilevel degenerative changes of the spine. There has been interval microdiskectomy with removal of extruded disc fragment at L2-3. 10. Grade 2 L5-S1 anterolisthesis with associated bilateral neural foramina stenosis. Electronically Signed   By: Elgie Collard M.D.   On: 05/10/2016 05:53   Dg Chest 2 View  Result Date: 05/03/2016 CLINICAL DATA:  Increasing cough, chest congestion, and shortness of breath over the past several days. History of CHF, hypertension,  COPD, current smoker. EXAM: CHEST  2 VIEW COMPARISON:  PA and lateral chest x-ray of March 18, 2016 FINDINGS: The lungs are adequately inflated. There is no alveolar infiltrate. The interstitial markings are coarse though stable. There are new coarse lung markings at the left lung base laterally. The cardiac silhouette is enlarged. The central pulmonary vascularity is mildly prominent but there is no cephalization. There is calcification in the wall of the thoracic aorta. The observed bony thorax exhibits no acute abnormality. There is old deformity of the junction of the middle and distal thirds of the left clavicle. IMPRESSION: COPD with mild chronic interstitial prominence. No overt CHF. New linear density at the left lung base compatible with atelectasis or early pneumonia. Followup PA and lateral chest X-ray is recommended in 3-4 weeks following trial of antibiotic therapy to ensure resolution and exclude underlying malignancy. Thoracic aortic  atherosclerosis. Electronically Signed   By: David  [place] M.D.   On: 05/13/2016 11:58   US Abdomen Limited Ruq  Result Date: 05/16/2016 CLINICAL DATA:  Abnormal liver function test. EXAM: US ABDOMEN LIMITED - RIGHT UPPER QUADRANT COMPARISON:  CT scan 05/10/2016. FINDINGS: Gallbladder: Multiple small stones noted in the gallbladder. Gallbladder wall appears thickened, measuring up to 11-2008 mm. Hypoechoic striations within the gallbladder wall is consistent with gallbladder wall edema. Sonographer reports no sonographic Murphy sign. Common bile duct: Diameter: 3 mm. Liver: Subtle nodularity of liver contour raises the question of , but is not diagnostic for cirrhosis. IMPRESSION: 1. Cholelithiasis with gallbladder wall edema and thickening. There is a question of underlying cirrhosis and recent CT scan shows diffuse body wall edema and intraperitoneal free fluid. Given the apparent systemic process, gallbladder wall thickening becomes less specific. Nevertheless,  acute cholecystitis remains a consideration. If there is clinical concern for cystic duct obstruction, nuclear scintigraphy would likely prove helpful to further evaluate. Electronically Signed   By: Kennith Center M.D.   On: 05/10/2016 12:33    Scheduled Meds: . aspirin EC  81 mg Oral QPM  . cholecalciferol  1,000 Units Oral Daily  . fluticasone  1 spray Each Nare Daily  . furosemide  40 mg Intravenous BID  . heparin  5,000 Units Subcutaneous Q8H  . insulin aspart  0-9 Units Subcutaneous TID WC  . ipratropium-albuterol  3 mL Nebulization Q8H  . pantoprazole  40 mg Oral Daily  . sodium chloride flush  3 mL Intravenous Q12H   Continuous Infusions:  Active Problems:   COPD (chronic obstructive pulmonary disease) (HCC)   Nicotine dependence   Acute hepatitis   Acute on chronic systolic CHF (congestive heart failure) (HCC)   Acute renal failure superimposed on stage 3 chronic kidney disease (HCC)   Atypical chest pain   Essential hypertension   Hyperkalemia    Time spent: 25 minutes    Vassie Loll  [facility] Pager [phone]. If 7PM-7AM, please contact night-coverage at [email or web address], password [facility] 05/12/2016, 10:04 PM  LOS: 1 day

## 2016-05-12 NOTE — Progress Notes (Signed)
PHARMACY - PHYSICIAN COMMUNICATION CRITICAL VALUE ALERT - BLOOD CULTURE IDENTIFICATION (BCID)  Results for orders placed or performed during the hospital encounter of 04/29/2016  Blood Culture ID Panel (Reflexed) (Collected: 04/24/2016  7:15 AM)  Result Value Ref Range   Enterococcus species NOT DETECTED NOT DETECTED   Listeria monocytogenes NOT DETECTED NOT DETECTED   Staphylococcus species DETECTED (A) NOT DETECTED   Staphylococcus aureus NOT DETECTED NOT DETECTED   Methicillin resistance NOT DETECTED NOT DETECTED   Streptococcus species NOT DETECTED NOT DETECTED   Streptococcus agalactiae NOT DETECTED NOT DETECTED   Streptococcus pneumoniae NOT DETECTED NOT DETECTED   Streptococcus pyogenes NOT DETECTED NOT DETECTED   Acinetobacter baumannii NOT DETECTED NOT DETECTED   Enterobacteriaceae species NOT DETECTED NOT DETECTED   Enterobacter cloacae complex NOT DETECTED NOT DETECTED   Escherichia coli NOT DETECTED NOT DETECTED   Klebsiella oxytoca NOT DETECTED NOT DETECTED   Klebsiella pneumoniae NOT DETECTED NOT DETECTED   Proteus species NOT DETECTED NOT DETECTED   Serratia marcescens NOT DETECTED NOT DETECTED   Haemophilus influenzae NOT DETECTED NOT DETECTED   Neisseria meningitidis NOT DETECTED NOT DETECTED   Pseudomonas aeruginosa NOT DETECTED NOT DETECTED   Candida albicans NOT DETECTED NOT DETECTED   Candida glabrata NOT DETECTED NOT DETECTED   Candida krusei NOT DETECTED NOT DETECTED   Candida parapsilosis NOT DETECTED NOT DETECTED   Candida tropicalis NOT DETECTED NOT DETECTED    Name of physician (or Provider) Contacted: [place]  Changes to prescribed antibiotics required: none, negligible suspicion for infection, considering contamination (likely CoNS)  Marrietta Thunder A 05/12/2016  12:19 PM

## 2016-05-12 NOTE — Progress Notes (Signed)
  Echocardiogram 2D Echocardiogram with Definity has been performed.  Nolon RodBrown, Tony 05/12/2016, 11:44 AM

## 2016-05-13 LAB — BLOOD CULTURE ID PANEL (REFLEXED)
Acinetobacter baumannii: NOT DETECTED
CANDIDA GLABRATA: NOT DETECTED
CANDIDA KRUSEI: NOT DETECTED
Candida albicans: NOT DETECTED
Candida parapsilosis: NOT DETECTED
Candida tropicalis: NOT DETECTED
ENTEROBACTER CLOACAE COMPLEX: NOT DETECTED
ENTEROBACTERIACEAE SPECIES: NOT DETECTED
ESCHERICHIA COLI: NOT DETECTED
Enterococcus species: NOT DETECTED
Haemophilus influenzae: NOT DETECTED
Klebsiella oxytoca: NOT DETECTED
Klebsiella pneumoniae: NOT DETECTED
Listeria monocytogenes: NOT DETECTED
Neisseria meningitidis: NOT DETECTED
PSEUDOMONAS AERUGINOSA: NOT DETECTED
Proteus species: NOT DETECTED
SERRATIA MARCESCENS: NOT DETECTED
STAPHYLOCOCCUS AUREUS BCID: NOT DETECTED
STREPTOCOCCUS PNEUMONIAE: NOT DETECTED
STREPTOCOCCUS PYOGENES: NOT DETECTED
Staphylococcus species: NOT DETECTED
Streptococcus agalactiae: NOT DETECTED
Streptococcus species: NOT DETECTED

## 2016-05-13 LAB — BASIC METABOLIC PANEL
ANION GAP: 12 (ref 5–15)
BUN: 51 mg/dL — ABNORMAL HIGH (ref 6–20)
CHLORIDE: 94 mmol/L — AB (ref 101–111)
CO2: 25 mmol/L (ref 22–32)
Calcium: 9.2 mg/dL (ref 8.9–10.3)
Creatinine, Ser: 1.57 mg/dL — ABNORMAL HIGH (ref 0.61–1.24)
GFR, EST AFRICAN AMERICAN: 50 mL/min — AB (ref >60)
GFR, EST NON AFRICAN AMERICAN: 43 mL/min — AB (ref >60)
Glucose, Bld: 155 mg/dL — ABNORMAL HIGH (ref 65–99)
POTASSIUM: 3.3 mmol/L — AB (ref 3.5–5.1)
SODIUM: 131 mmol/L — AB (ref 135–145)

## 2016-05-13 LAB — GLUCOSE, CAPILLARY
GLUCOSE-CAPILLARY: 161 mg/dL — AB (ref 65–99)
Glucose-Capillary: 164 mg/dL — ABNORMAL HIGH (ref 65–99)
Glucose-Capillary: 211 mg/dL — ABNORMAL HIGH (ref 65–99)
Glucose-Capillary: 195 mg/dL — ABNORMAL HIGH (ref 65–99)

## 2016-05-13 LAB — CMV DNA, QUANTITATIVE, PCR
CMV DNA QUANT: NEGATIVE [IU]/mL
LOG10 CMV QN DNA PL: UNDETERMINED

## 2016-05-13 MED ORDER — LORAZEPAM 0.5 MG PO TABS
0.5000 mg | ORAL_TABLET | Freq: Once | ORAL | Status: AC
  Administered 2016-05-13: 0.5 mg via ORAL
  Filled 2016-05-13: qty 1

## 2016-05-13 MED ORDER — FUROSEMIDE 40 MG PO TABS
60.0000 mg | ORAL_TABLET | Freq: Every day | ORAL | Status: DC
  Administered 2016-05-13 – 2016-05-15 (×3): 60 mg via ORAL
  Filled 2016-05-13 (×3): qty 1

## 2016-05-13 MED ORDER — ZOLPIDEM TARTRATE 5 MG PO TABS
2.5000 mg | ORAL_TABLET | Freq: Every evening | ORAL | Status: DC | PRN
  Administered 2016-05-14: 2.5 mg via ORAL
  Filled 2016-05-13: qty 1

## 2016-05-13 MED ORDER — POTASSIUM CHLORIDE CRYS ER 20 MEQ PO TBCR
40.0000 meq | EXTENDED_RELEASE_TABLET | Freq: Every day | ORAL | Status: DC
  Administered 2016-05-13 – 2016-05-14 (×2): 40 meq via ORAL
  Filled 2016-05-13 (×2): qty 2

## 2016-05-13 MED ORDER — ALBUTEROL SULFATE (2.5 MG/3ML) 0.083% IN NEBU
2.5000 mg | INHALATION_SOLUTION | Freq: Four times a day (QID) | RESPIRATORY_TRACT | Status: DC | PRN
  Administered 2016-05-13: 2.5 mg via RESPIRATORY_TRACT
  Filled 2016-05-13: qty 3

## 2016-05-13 NOTE — Evaluation (Signed)
Occupational Therapy Evaluation Patient Details Name: Howard Dixon MRN: [id number] DOB: [birth year] Today's Date: 05/13/2016    History of Present Illness 71 yo male admitted with acute hepatitis, CHF, atypical chest pain with elevated troponin. Hx of CHF, CAD, COPD, DM, aortic aneurysm, L2-L3 microdiscetomy 07/2015   Clinical Impression   Pt admitted with CHF and chest pain. Pt currently with functional limitations due to the deficits listed below (see OT Problem List). Pt will benefit from skilled OT to increase their safety and independence with ADL and functional mobility for ADL to facilitate discharge to venue listed below.      Follow Up Recommendations  Supervision/Assistance - 24 hour    Equipment Recommendations  None recommended by OT       Precautions / Restrictions Precautions Precautions: Fall Precaution Comments: monitor O2 sats Restrictions Weight Bearing Restrictions: No      Mobility Bed Mobility               General bed mobility comments: pt in chair  Transfers Overall transfer level: Needs assistance   Transfers: Sit to/from Stand Sit to Stand: Min assist         General transfer comment: pt feels he is leaning posterior but is able to self correct         ADL Overall ADL's : Needs assistance/impaired Eating/Feeding: Set up;Sitting   Grooming: Min guard;Standing;Cueing for safety   Upper Body Bathing: Set up;Sitting   Lower Body Bathing: Minimal assistance;Sit to/from stand;Cueing for safety;Cueing for sequencing   Upper Body Dressing : Set up;Sitting   Lower Body Dressing: Minimal assistance;Sit to/from stand;Cueing for safety;Cueing for sequencing;Cueing for compensatory techniques   Toilet Transfer: Min guard;RW;Ambulation;Cueing for sequencing;Comfort height toilet   Toileting- Clothing Manipulation and Hygiene: Minimal assistance;Sit to/from stand;Cueing for sequencing       Functional mobility during ADLs: Cueing for  sequencing;Minimal assistance;Rolling walker;Cueing for safety       Vision Patient Visual Report: No change from baseline              Pertinent Vitals/Pain Pain Assessment: No/denies pain        Extremity/Trunk Assessment         Cervical / Trunk Assessment Cervical / Trunk Assessment: Normal   Communication Communication Communication: No difficulties   Cognition Arousal/Alertness: Awake/alert Behavior During Therapy: WFL for tasks assessed/performed Overall Cognitive Status: Within Functional Limits for tasks assessed                                Home Living Family/patient expects to be discharged to:: Private residence Living Arrangements: Spouse/significant other Available Help at Discharge: Family Type of Home: Mobile home Home Access: Stairs to enter Secretary/administrator of Steps: 5-6 Entrance Stairs-Rails: Can reach both Home Layout: One level     Bathroom Shower/Tub: Producer, television/film/video: Standard     Home Equipment: Environmental consultant - 2 wheels          Prior Functioning/Environment Level of Independence: Independent                 OT Problem List: Decreased strength;Decreased activity tolerance      OT Treatment/Interventions: Self-care/ADL training;DME and/or AE instruction;Patient/family education    OT Goals(Current goals can be found in the care plan section) Acute Rehab OT Goals Patient Stated Goal: to get stronger OT Goal Formulation: With patient Time For Goal Achievement: 05/27/16  OT Frequency: Min 2X/week  End of Session Equipment Utilized During Treatment: Engineer, waterolling walker Nurse Communication: Mobility status  Activity Tolerance: Patient tolerated treatment well Patient left: in chair  OT Visit Diagnosis: Unsteadiness on feet (R26.81);Muscle weakness (generalized) (M62.81)                ADL either performed or assessed with clinical judgement  Time: 1230-1246 OT Time Calculation  (min): 16 min Charges:  OT General Charges $OT Visit: 1 Procedure OT Evaluation $OT Eval Moderate Complexity: 1 Procedure G-Codes:     Lise AuerLori Nafisah Runions, OT [phone]  Einar CrowEDDING, Harrington Jobe D 05/13/2016, 1:10 PM

## 2016-05-13 NOTE — Progress Notes (Signed)
[facility] PROGRESS NOTE  Howard Dixon ZOX:[id number] DOB: March 31, 1945 DOA: 05/03/2016 PCP: Tillman Abide, MD  Interim summary and HPI 72 y.o. male with medical history of systolic CHF with EF 25-30 percent, coronary artery disease with CTO of pLAD, COPD with continued tobacco abuse, hypertension, diabetes mellitus, descending aortic aneurysm presenting with one-week history of abdominal pain and worsening shortness of breath. The patient states that eating and activity occasionally exacerbates his abdominal pain. He felt that his furosemide, which he was started on one month ago, caused worsening abdominal pain. As a result, he stopped taking it 1 week prior to this admission. Since then, the patient has had increasing shortness of breath and coughing. The patient has had increasing lower extremity edema in the past week with 5-6 pound weight gain.  Assessment/Plan: Acute on chronic combined systolic and diastolic CHF -EF 20 percent on repeat echo; diffuse hypokinesis and grade 2 diastolic HF. -continue daily weights and strict I/O's -low sodium diet -continue Lasix; will switch to PO diuretic  Transaminasemia -suspect hepatic congestion from CHF -acetaminophen level negatve -Viral hepatitis panel negative -trend LFTs -CMV DNA -EBV DNA -d/c statin for now  Acute on chronic renal failure--CKD 3 at baseline -monitor with diuresis -holding lisinopril -due to decrease perfusion with acute CHF -Cr continue improving  Hyperkalemia -kayexalate x 1 given on admission  -continue telemetry monitoring  -d/c lisinopril and Kcl supplement -K 3.9 today  Atypical chest pain -due to CHF exacerbation -troponin elevated from demand ischemia most likely -EKG without concerning ischemic changes -continue ASA -given diffuse hypokinesis less likely to be CAD related; but because EF 20% will consult cardiology service  Hyponatremia -due to fluid overload and possible underlying renal  failure -continue diuresis -Na 130  Leukocytosis -likely stress demargination -UA negative for UTI and CXR w/o infiltrates -blood cultures x 2 sets ordered; and 1/2 demonstrated coagulase neg staph (contamination) -no abd pain and no source of infection appreciated  COPD -He didn't qualify for home oxygen, but endorses he uses his wife's oxygen prn -feeling good on 2 L; but has proven not to needed. Will wean him off.  -continue Duonebs q 8; no wheezing -will assess oxygen needs on exertion and RA  DM2 -novolog sliding scale -holding glipizide and metformin while inpatient -A1C 6.5  HTN -holding metoprolol due to soft BP -will monitor VS  Leg pain and edema -venous duplex neg -swelling secondary to CHF  Code Status: Full Family Communication: no family at bedside Disposition Plan: remains inpatient, will transition to PO lasix, follow strict intake and output and follow renal function.   Consultants:  None   Procedures:  LE duplex: -Preliminary report:  Bilateral:  No evidence of DVT, superficial thrombosis, or Baker's Cyst.   2-D echo: - Left ventricle: The cavity size was mildly dilated. Wall   thickness was normal. The estimated ejection fraction was 20%.   Diffuse hypokinesis. No LV thrombus noted. Features are   consistent with a pseudonormal left ventricular filling pattern,   with concomitant abnormal relaxation and increased filling   pressure (grade 2 diastolic dysfunction). - Aortic valve: Trileaflet; moderately calcified leaflets. There   was moderate stenosis. There was mild regurgitation. Mean   gradient (S): 21 mm Hg. Peak gradient (S): 32 mm Hg. Valve area   (VTI): 1.4 cm^2. - Aorta: Dilated aortic root and ascending aorta. Ascending aorta   dimension: 46 mm. Aortic root dimension: 44 mm (ED). - Mitral valve: There was no significant regurgitation. - Left atrium:  The atrium was mildly dilated. - Right ventricle: Poorly visualized. The  cavity size was normal.   Systolic function was mildly to moderately reduced. - Tricuspid valve: Peak RV-RA gradient (S): 41 mm Hg. - Pulmonary arteries: PA peak pressure: 56 mm Hg (S). - Systemic veins: IVC measured 2.6 cm with < 50% respirophasic   variation, suggesting RA pressure 15 mmHg.  Impressions: - Mildly dilated LV with EF 20%, diffuse hypokinesis. Moderate   diastolic dysfunction. Normal RV size with mild to moderately   decreased systolic function. Moderate aortic stenosis. Moderate   pulmonary hypertension. Dilated IVC suggestive of elevated RV   filling pressure.  Antibiotics:  None   HPI/Subjective: Denies CP. Reported increase urine output and improvement overall in his breathing.  Objective: Vitals:   05/13/16 1516 05/13/16 1620  BP: 96/78 102/72  Pulse: (!) 112 (!) 105  Resp: 19 20  Temp: 97.5 F (36.4 C) 97.2 F (36.2 C)    Intake/Output Summary (Last 24 hours) at 05/13/16 1721 Last data filed at 05/13/16 1500  Gross per 24 hour  Intake              480 ml  Output             1700 ml  Net            -1220 ml   Filed Weights   05/19/2016 1457 05/12/16 0432 05/13/16 0514  Weight: 76.7 kg (169 lb 1.5 oz) 77.2 kg (170 lb 3.1 oz) 75.7 kg (166 lb 14.2 oz)    Exam:   General: afebrile, no CP and breathing much easier. Patient using [place] oxygen supplementation (2L); despite demonstrating not need for oxygen requirement at rest or exertion.   Cardiovascular: no JVD see on exam, soft SEM, no rubs, no gallops  Respiratory: no frank crackles appreciated at bases; but with decrease breath sound. No wheezing, scattered rhonchi  Abdomen: soft, NT, positive BS  Musculoskeletal: trace edema bilaterally, no cyanosis   Data Reviewed: Basic Metabolic Panel:  Recent Labs Lab 05/07/2016 0045 04/26/2016 1255 05/12/16 0528 05/13/16 0529  NA 126*  --  130* 131*  K 5.7*  --  3.9 3.3*  CL 92*  --  94* 94*  CO2 19*  --  25 25  GLUCOSE 63*  --  150* 155*  BUN  72*  --  67* 51*  CREATININE 2.31*  --  2.08* 1.57*  CALCIUM 10.5*  --  9.3 9.2  MG  --  2.5*  --   --    Liver Function Tests:  Recent Labs Lab 05/16/2016 0045  AST 2,242*  ALT 2,232*  ALKPHOS 74  BILITOT 2.6*  PROT 7.8  ALBUMIN 4.1    Recent Labs Lab 04/22/2016 0045  LIPASE 41   CBC:  Recent Labs Lab 05/10/2016 0045  WBC 12.3*  NEUTROABS 9.6*  HGB 12.7*  HCT 36.5*  MCV 94.3  PLT 144*   Cardiac Enzymes:  Recent Labs Lab 04/26/2016 1255 05/01/2016 1743 04/22/2016 2316  CKTOTAL 612*  --   --   TROPONINI 0.10* 0.17* 0.16*   BNP (last 3 results)  Recent Labs  03/18/16 0448  BNP 923.0*   CBG:  Recent Labs Lab 05/12/16 1707 05/12/16 2213 05/13/16 0730 05/13/16 1139 05/13/16 1706  GLUCAP 192* 222* 164* 161* 211*    Recent Results (from the past 240 hour(s))  Blood Culture ID Panel (Reflexed)     Status: None   Collection Time: 05/16/2016  6:41 AM  Result Value Ref Range Status   Enterococcus species NOT DETECTED NOT DETECTED Final   Listeria monocytogenes NOT DETECTED NOT DETECTED Final   Staphylococcus species NOT DETECTED NOT DETECTED Final   Staphylococcus aureus NOT DETECTED NOT DETECTED Final   Streptococcus species NOT DETECTED NOT DETECTED Final   Streptococcus agalactiae NOT DETECTED NOT DETECTED Final   Streptococcus pneumoniae NOT DETECTED NOT DETECTED Final   Streptococcus pyogenes NOT DETECTED NOT DETECTED Final   Acinetobacter baumannii NOT DETECTED NOT DETECTED Final   Enterobacteriaceae species NOT DETECTED NOT DETECTED Final   Enterobacter cloacae complex NOT DETECTED NOT DETECTED Final   Escherichia coli NOT DETECTED NOT DETECTED Final   Klebsiella oxytoca NOT DETECTED NOT DETECTED Final   Klebsiella pneumoniae NOT DETECTED NOT DETECTED Final   Proteus species NOT DETECTED NOT DETECTED Final   Serratia marcescens NOT DETECTED NOT DETECTED Final   Haemophilus influenzae NOT DETECTED NOT DETECTED Final   Neisseria meningitidis NOT  DETECTED NOT DETECTED Final   Pseudomonas aeruginosa NOT DETECTED NOT DETECTED Final   Candida albicans NOT DETECTED NOT DETECTED Final   Candida glabrata NOT DETECTED NOT DETECTED Final   Candida krusei NOT DETECTED NOT DETECTED Final   Candida parapsilosis NOT DETECTED NOT DETECTED Final   Candida tropicalis NOT DETECTED NOT DETECTED Final    Comment: Performed at [facility], [address], [place], [place] [address]  Culture, blood (Routine X 2) w Reflex to ID Panel     Status: None (Preliminary result)   Collection Time: 05/09/2016  6:50 AM  Result Value Ref Range Status   Specimen Description BLOOD LEFT WRIST  Final   Special Requests BOTTLES DRAWN AEROBIC AND ANAEROBIC 10CC  Final   Culture   Final    NO GROWTH 2 DAYS Performed at [facility], [address], [place], [place] [address]    Report Status PENDING  Incomplete  Culture, blood (Routine X 2) w Reflex to ID Panel     Status: Abnormal (Preliminary result)   Collection Time: 05/16/2016  7:15 AM  Result Value Ref Range Status   Specimen Description BLOOD WRIST  Final   Special Requests BOTTLES DRAWN AEROBIC AND ANAEROBIC 10CC  Final   Culture  Setup Time   Final    GRAM POSITIVE COCCI IN CLUSTERS AEROBIC BOTTLE ONLY CRITICAL RESULT CALLED TO, READ BACK BY AND VERIFIED WITH: PHARMD E JACKSON [phone] MLM GRAM POSITIVE RODS ANAEROBIC BOTTLE ONLY CRITICAL RESULT CALLED TO, READ BACK BY AND VERIFIED WITH: BETH GREEN,PHARMD @0528  022/22/18 MKELLY,MLT    Culture (A)  Final    STAPHYLOCOCCUS SPECIES (COAGULASE NEGATIVE) THE SIGNIFICANCE OF ISOLATING THIS ORGANISM FROM A SINGLE SET OF BLOOD CULTURES WHEN MULTIPLE SETS ARE DRAWN IS UNCERTAIN. PLEASE NOTIFY THE MICROBIOLOGY DEPARTMENT WITHIN ONE WEEK IF SPECIATION AND SENSITIVITIES ARE REQUIRED. Performed at [facility], [address] [place], [place] [address]    Report Status PENDING  Incomplete  Blood Culture ID Panel (Reflexed)     Status: Abnormal    Collection Time: 05/08/2016  7:15 AM  Result Value Ref Range Status   Enterococcus species NOT DETECTED NOT DETECTED Final   Listeria monocytogenes NOT DETECTED NOT DETECTED Final   Staphylococcus species DETECTED (A) NOT DETECTED Final    Comment: Methicillin (oxacillin) susceptible coagulase negative staphylococcus. Possible blood culture contaminant (unless isolated from more than one blood culture draw or clinical case suggests pathogenicity). No antibiotic treatment is indicated for blood  culture contaminants. CRITICAL  RESULT CALLED TO, READ BACK BY AND VERIFIED WITH: PHARMD E JACKSON [phone] MLM    Staphylococcus aureus NOT DETECTED NOT DETECTED Final   Methicillin resistance NOT DETECTED NOT DETECTED Final   Streptococcus species NOT DETECTED NOT DETECTED Final   Streptococcus agalactiae NOT DETECTED NOT DETECTED Final   Streptococcus pneumoniae NOT DETECTED NOT DETECTED Final   Streptococcus pyogenes NOT DETECTED NOT DETECTED Final   Acinetobacter baumannii NOT DETECTED NOT DETECTED Final   Enterobacteriaceae species NOT DETECTED NOT DETECTED Final   Enterobacter cloacae complex NOT DETECTED NOT DETECTED Final   Escherichia coli NOT DETECTED NOT DETECTED Final   Klebsiella oxytoca NOT DETECTED NOT DETECTED Final   Klebsiella pneumoniae NOT DETECTED NOT DETECTED Final   Proteus species NOT DETECTED NOT DETECTED Final   Serratia marcescens NOT DETECTED NOT DETECTED Final   Haemophilus influenzae NOT DETECTED NOT DETECTED Final   Neisseria meningitidis NOT DETECTED NOT DETECTED Final   Pseudomonas aeruginosa NOT DETECTED NOT DETECTED Final   Candida albicans NOT DETECTED NOT DETECTED Final   Candida glabrata NOT DETECTED NOT DETECTED Final   Candida krusei NOT DETECTED NOT DETECTED Final   Candida parapsilosis NOT DETECTED NOT DETECTED Final   Candida tropicalis NOT DETECTED NOT DETECTED Final    Comment: Performed at [facility], [address] [place], [place]  [address]     Studies: No results found.  Scheduled Meds: . aspirin EC  81 mg Oral QPM  . cholecalciferol  1,000 Units Oral Daily  . fluticasone  1 spray Each Nare Daily  . furosemide  60 mg Oral Daily  . heparin  5,000 Units Subcutaneous Q8H  . insulin aspart  0-9 Units Subcutaneous TID WC  . ipratropium-albuterol  3 mL Nebulization Q8H  . pantoprazole  40 mg Oral Daily  . potassium chloride  40 mEq Oral Daily  . sodium chloride flush  3 mL Intravenous Q12H   Continuous Infusions:  Active Problems:   COPD (chronic obstructive pulmonary disease) (HCC)   Nicotine dependence   Acute hepatitis   Acute on chronic systolic CHF (congestive heart failure) (HCC)   Acute renal failure superimposed on stage 3 chronic kidney disease (HCC)   Atypical chest pain   Essential hypertension   Hyperkalemia    Time spent: 25 minutes    Vassie Loll  [facility] Pager [phone]. If 7PM-7AM, please contact night-coverage at [email or web address], password [facility] 05/13/2016, 5:21 PM  LOS: 2 days

## 2016-05-13 NOTE — Progress Notes (Signed)
SATURATION QUALIFICATIONS: (This note is used to comply with regulatory documentation for home oxygen)  Patient Saturations on Room Air at Rest 93  Patient Saturations on Room Air while Ambulating =89  Patient Saturations on 1 Liters of oxygen while Ambulating =95  Please briefly explain why patient needs home oxygen:

## 2016-05-13 NOTE — Progress Notes (Signed)
Nutrition Education Note  Pt was discussed during rounds this AM. Dr. Gwenlyn PerkingMadera talked to pt yesterday and today during rounds about low Na diet and gave examples of foods to avoid or limit because of Na content. He encouraged pt to follow 2000 mg Na limit.   RD provided "Low Sodium Nutrition Therapy," "Sodium-free Flavoring Tips," and "Sodium Content of Foods" handouts from the Academy of [employer] and Dietetics. Reviewed patient's dietary recall. Provided examples on ways to decrease sodium intake in diet. Discouraged intake of processed foods and use of salt shaker. Encouraged fresh fruits and vegetables as well as whole grain sources of carbohydrates to maximize fiber intake.   Pt reports that he and his wife enjoy eating tacos, usually every other day. Discussed that if this is something he is not willing to change to ensure that he is taking sodium in this item into account and that he will need to make tradeoffs with other foods throughout the day. Reminded him of 2000 mg Na limit. Pt states that he had started to use the salt shaker less at home but that he was not doing this consistently.   RD discussed why it is important for patient to adhere to diet recommendations, and emphasized the role of fluids, foods to avoid, and importance of weighing self daily. Teach back method used.  Expect fair compliance.  Body mass index is 26.94 kg/m. Pt meets criteria for overweight based on current BMI.  Current diet order is 2 gram Na, patient is consuming approximately 25-100% of meals at this time. Labs and medications reviewed. No further nutrition interventions warranted at this time. RD contact information provided. If additional nutrition issues arise, please re-consult RD.      Howard GammonJessica Vivan Agostino, MS, RD, LDN, [facility] Inpatient Clinical Dietitian Pager # [phone] After hours/weekend pager # [phone]

## 2016-05-13 NOTE — Care Management Note (Signed)
Case Management Note  Patient Details  Name: Howard Dixon MRN: [id number] Date of Birth: May 15, 1945  Subjective/Objective:PT-recc HHPT-patient [facility] the [facility] Noted doesn't qualify for [facility] 02. Await HHPT,[id number] order.                    Action/Plan:d/c home w/HHC.   Expected Discharge Date:   (UNKNOWN)               Expected Discharge Plan:  Home w Home Health Services  In-House Referral:     Discharge planning Services  CM Consult  Post Acute Care Choice:    Choice offered to:  Patient  DME Arranged:    DME Agency:     HH Arranged:    HH Agency:     Status of Service:  In process, will continue to follow  If discussed at Long Length of Stay Meetings, dates discussed:    Additional Comments:  Lanier ClamMahabir, Jode Lippe, RN 05/13/2016, 1:14 PM

## 2016-05-13 NOTE — Progress Notes (Signed)
PHARMACY - PHYSICIAN COMMUNICATION CRITICAL VALUE ALERT - BLOOD CULTURE IDENTIFICATION (BCID)  Results for orders placed or performed during the hospital encounter of 11-16-2016  Blood Culture ID Panel (Reflexed) (Collected: September 05, 2016  6:41 AM)  Result Value Ref Range   Enterococcus species NOT DETECTED NOT DETECTED   Listeria monocytogenes NOT DETECTED NOT DETECTED   Staphylococcus species NOT DETECTED NOT DETECTED   Staphylococcus aureus NOT DETECTED NOT DETECTED   Streptococcus species NOT DETECTED NOT DETECTED   Streptococcus agalactiae NOT DETECTED NOT DETECTED   Streptococcus pneumoniae NOT DETECTED NOT DETECTED   Streptococcus pyogenes NOT DETECTED NOT DETECTED   Acinetobacter baumannii NOT DETECTED NOT DETECTED   Enterobacteriaceae species NOT DETECTED NOT DETECTED   Enterobacter cloacae complex NOT DETECTED NOT DETECTED   Escherichia coli NOT DETECTED NOT DETECTED   Klebsiella oxytoca NOT DETECTED NOT DETECTED   Klebsiella pneumoniae NOT DETECTED NOT DETECTED   Proteus species NOT DETECTED NOT DETECTED   Serratia marcescens NOT DETECTED NOT DETECTED   Haemophilus influenzae NOT DETECTED NOT DETECTED   Neisseria meningitidis NOT DETECTED NOT DETECTED   Pseudomonas aeruginosa NOT DETECTED NOT DETECTED   Candida albicans NOT DETECTED NOT DETECTED   Candida glabrata NOT DETECTED NOT DETECTED   Candida krusei NOT DETECTED NOT DETECTED   Candida parapsilosis NOT DETECTED NOT DETECTED   Candida tropicalis NOT DETECTED NOT DETECTED   Gm + Rods in x1 anaerobic bottle  Name of physician (or Provider) Contacted: Howard Dixon  Changes to prescribed antibiotics required: could be contaminate, but if concern still for cholecystitis could possibly be clostridium perfringes, which Unasyn would cover.   Howard Dixon, Howard Dixon 05/13/2016  5:52 AM

## 2016-05-13 NOTE — Progress Notes (Signed)
Physical Therapy Treatment Patient Details Name: Howard RidgeJohn W Dixon MRN: [id number] DOB: [birth year] Today's Date: 05/13/2016    History of Present Illness 71 yo male admitted with acute hepatitis, CHF, atypical chest pain with elevated troponin. 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  Performed well. SATURATION QUALIFICATIONS: (This note is used to comply with regulatory documentation for home oxygen)  Patient Saturations on Room Air at Rest = 98%  Patient Saturations on Room Air while Ambulating = 97%  Pt does not require supplemental O2   Follow Up Recommendations  Home health PT     Equipment Recommendations  None recommended by PT    Recommendations for Other Services       Precautions / Restrictions Precautions Precautions: Fall Precaution Comments: monitor O2 sats Restrictions Weight Bearing Restrictions: No    Mobility  Bed Mobility               General bed mobility comments: OOB in recliner  Transfers Overall transfer level: Needs assistance Equipment used: None Transfers: Sit to/from Stand Sit to Stand: Supervision;Min guard         General transfer comment: 25% VC's on proper hand placement and safety with turns using walker correctly  Ambulation/Gait Ambulation/Gait assistance: Supervision;Min guard Ambulation Distance (Feet): 82 Feet Assistive device: Rolling walker (2 wheeled) Gait Pattern/deviations: Step-to pattern;Step-through pattern Gait velocity: decreased   General Gait Details: amb on RA avg sats 97% with HR 119.  Pt performs better with walker than without.  Pt agrees.    Stairs            Wheelchair Mobility    Modified Rankin (Stroke Patients Only)       Balance                                    Cognition Arousal/Alertness: Awake/alert Behavior During Therapy: WFL for tasks assessed/performed Overall  Cognitive Status: Within Functional Limits for tasks assessed                      Exercises      General Comments        Pertinent Vitals/Pain Pain Assessment: No/denies pain    Home Living                      Prior Function            PT Goals (current goals can now be found in the care plan section) Progress towards PT goals: Progressing toward goals    Frequency    Min 3X/week      PT Plan Current plan remains appropriate    Co-evaluation             End of Session Equipment Utilized During Treatment: Gait belt Activity Tolerance: Patient tolerated treatment well Patient left: in chair;with call bell/phone within reach;with chair alarm set Nurse Communication: Mobility status PT Visit Diagnosis: Difficulty in walking, not elsewhere classified (R26.2)     Time: [id number] PT Time Calculation (min) (ACUTE ONLY): 11 min  Charges:  $Gait Training: 8-22 mins                    G Codes:       Felecia ShellingLori Arjun Hard  PTA [facility]  Acute  Rehab  Pager      [phone]

## 2016-05-14 ENCOUNTER — Inpatient Hospital Stay (HOSPITAL_COMMUNITY): Payer: Non-veteran care

## 2016-05-14 DIAGNOSIS — N179 Acute kidney failure, unspecified: Secondary | ICD-10-CM

## 2016-05-14 DIAGNOSIS — I4891 Unspecified atrial fibrillation: Secondary | ICD-10-CM

## 2016-05-14 DIAGNOSIS — N183 Chronic kidney disease, stage 3 (moderate): Secondary | ICD-10-CM

## 2016-05-14 DIAGNOSIS — I4892 Unspecified atrial flutter: Secondary | ICD-10-CM

## 2016-05-14 DIAGNOSIS — J438 Other emphysema: Secondary | ICD-10-CM

## 2016-05-14 DIAGNOSIS — I5023 Acute on chronic systolic (congestive) heart failure: Secondary | ICD-10-CM

## 2016-05-14 DIAGNOSIS — J9601 Acute respiratory failure with hypoxia: Secondary | ICD-10-CM

## 2016-05-14 LAB — COMPREHENSIVE METABOLIC PANEL
ALBUMIN: 3.8 g/dL (ref 3.5–5.0)
ALT: 1060 U/L — ABNORMAL HIGH (ref 17–63)
ANION GAP: 14 (ref 5–15)
AST: 432 U/L — ABNORMAL HIGH (ref 15–41)
Alkaline Phosphatase: 66 U/L (ref 38–126)
BILIRUBIN TOTAL: 3.9 mg/dL — AB (ref 0.3–1.2)
BUN: 43 mg/dL — ABNORMAL HIGH (ref 6–20)
CO2: 22 mmol/L (ref 22–32)
Calcium: 9.4 mg/dL (ref 8.9–10.3)
Chloride: 92 mmol/L — ABNORMAL LOW (ref 101–111)
Creatinine, Ser: 1.69 mg/dL — ABNORMAL HIGH (ref 0.61–1.24)
GFR, EST AFRICAN AMERICAN: 46 mL/min — AB (ref >60)
GFR, EST NON AFRICAN AMERICAN: 40 mL/min — AB (ref >60)
Glucose, Bld: 193 mg/dL — ABNORMAL HIGH (ref 65–99)
POTASSIUM: 4.2 mmol/L (ref 3.5–5.1)
Sodium: 128 mmol/L — ABNORMAL LOW (ref 135–145)
TOTAL PROTEIN: 7.2 g/dL (ref 6.5–8.1)

## 2016-05-14 LAB — HEPARIN LEVEL (UNFRACTIONATED)
Heparin Unfractionated: 0.13 IU/mL — ABNORMAL LOW (ref 0.30–0.70)
Heparin Unfractionated: 0.32 [IU]/mL (ref 0.30–0.70)

## 2016-05-14 LAB — BLOOD GAS, ARTERIAL
Acid-base deficit: 13.5 mmol/L — ABNORMAL HIGH (ref 0.0–2.0)
BICARBONATE: 11.9 mmol/L — AB (ref 20.0–28.0)
Drawn by: 331471
FIO2: 100
LHR: 14 {breaths}/min
O2 Saturation: 99.7 %
PEEP: 5 cmH2O
Patient temperature: 98.6
VT: 510 mL
pCO2 arterial: 27 mmHg — ABNORMAL LOW (ref 32.0–48.0)
pH, Arterial: 7.267 — ABNORMAL LOW (ref 7.350–7.450)
pO2, Arterial: 460 mmHg — ABNORMAL HIGH (ref 83.0–108.0)

## 2016-05-14 LAB — GLUCOSE, CAPILLARY
GLUCOSE-CAPILLARY: 214 mg/dL — AB (ref 65–99)
Glucose-Capillary: 199 mg/dL — ABNORMAL HIGH (ref 65–99)
Glucose-Capillary: 213 mg/dL — ABNORMAL HIGH (ref 65–99)
Glucose-Capillary: 203 mg/dL — ABNORMAL HIGH (ref 65–99)
Glucose-Capillary: 192 mg/dL — ABNORMAL HIGH (ref 65–99)

## 2016-05-14 LAB — CBC
HCT: 38.5 % — ABNORMAL LOW (ref 39.0–52.0)
Hemoglobin: 13.3 g/dL (ref 13.0–17.0)
MCH: 33 pg (ref 26.0–34.0)
MCHC: 34.5 g/dL (ref 30.0–36.0)
MCV: 95.5 fL (ref 78.0–100.0)
PLATELETS: 123 10*3/uL — AB (ref 150–400)
RBC: 4.03 MIL/uL — AB (ref 4.22–5.81)
RDW: 15 % (ref 11.5–15.5)
WBC: 10.9 10*3/uL — ABNORMAL HIGH (ref 4.0–10.5)

## 2016-05-14 LAB — EPSTEIN BARR VRS(EBV DNA BY PCR)
EBV DNA QN by PCR: NEGATIVE copies/mL
log10 EBV DNA Qn PCR: UNDETERMINED log10 copy/mL

## 2016-05-14 LAB — PROTIME-INR
INR: 1.53
PROTHROMBIN TIME: 18.5 s — AB (ref 11.4–15.2)

## 2016-05-14 LAB — TROPONIN I
TROPONIN I: 0.17 ng/mL — AB (ref <0.03)
Troponin I: 0.12 ng/mL (ref <0.03)
Troponin I: 0.29 ng/mL (ref <0.03)

## 2016-05-14 LAB — MRSA PCR SCREENING: MRSA by PCR: NEGATIVE

## 2016-05-14 LAB — MAGNESIUM: Magnesium: 1.9 mg/dL (ref 1.7–2.4)

## 2016-05-14 LAB — PHOSPHORUS: PHOSPHORUS: 6.5 mg/dL — AB (ref 2.5–4.6)

## 2016-05-14 MED ORDER — DIGOXIN 0.25 MG/ML IJ SOLN
0.2500 mg | Freq: Once | INTRAMUSCULAR | Status: DC
  Filled 2016-05-14: qty 1

## 2016-05-14 MED ORDER — FENTANYL CITRATE (PF) 100 MCG/2ML IJ SOLN
50.0000 ug | INTRAMUSCULAR | Status: DC | PRN
  Administered 2016-05-14 (×2): 50 ug via INTRAVENOUS
  Filled 2016-05-14 (×3): qty 2

## 2016-05-14 MED ORDER — MIDAZOLAM HCL 2 MG/2ML IJ SOLN
1.0000 mg | INTRAMUSCULAR | Status: AC | PRN
  Administered 2016-05-14 – 2016-05-16 (×3): 1 mg via INTRAVENOUS
  Filled 2016-05-14 (×2): qty 2

## 2016-05-14 MED ORDER — NOREPINEPHRINE BITARTRATE 1 MG/ML IV SOLN
0.0000 ug/min | INTRAVENOUS | Status: DC
  Administered 2016-05-14: 20 ug/min via INTRAVENOUS
  Administered 2016-05-14: 35 ug/min via INTRAVENOUS
  Administered 2016-05-15 (×2): 20 ug/min via INTRAVENOUS
  Filled 2016-05-14 (×5): qty 4

## 2016-05-14 MED ORDER — PRO-STAT SUGAR FREE PO LIQD
30.0000 mL | Freq: Two times a day (BID) | ORAL | Status: DC
  Administered 2016-05-14 – 2016-05-15 (×4): 30 mL
  Filled 2016-05-14 (×4): qty 30

## 2016-05-14 MED ORDER — HEPARIN (PORCINE) IN NACL 100-0.45 UNIT/ML-% IJ SOLN: 1250.0000 [IU]/h | INTRAMUSCULAR | Status: DC

## 2016-05-14 MED ORDER — AMIODARONE LOAD VIA INFUSION
150.0000 mg | Freq: Once | INTRAVENOUS | Status: AC
  Administered 2016-05-14: 150 mg via INTRAVENOUS
  Filled 2016-05-14: qty 83.34

## 2016-05-14 MED ORDER — FENTANYL CITRATE (PF) 100 MCG/2ML IJ SOLN
50.0000 ug | INTRAMUSCULAR | Status: DC | PRN
  Administered 2016-05-14: 50 ug via INTRAVENOUS
  Filled 2016-05-14: qty 2

## 2016-05-14 MED ORDER — AMIODARONE HCL IN DEXTROSE 360-4.14 MG/200ML-% IV SOLN
30.0000 mg/h | INTRAVENOUS | Status: DC
  Administered 2016-05-14 – 2016-05-16 (×5): 30 mg/h via INTRAVENOUS
  Filled 2016-05-14 (×6): qty 200

## 2016-05-14 MED ORDER — METOPROLOL TARTRATE 5 MG/5ML IV SOLN
INTRAVENOUS | Status: AC
  Filled 2016-05-14: qty 5

## 2016-05-14 MED ORDER — FENTANYL CITRATE (PF) 100 MCG/2ML IJ SOLN
INTRAMUSCULAR | Status: AC
  Administered 2016-05-14: 200 ug
  Filled 2016-05-14: qty 8

## 2016-05-14 MED ORDER — AMIODARONE IV BOLUS ONLY 150 MG/100ML
150.0000 mg | Freq: Once | INTRAVENOUS | Status: AC
  Administered 2016-05-14: 150 mg via INTRAVENOUS
  Filled 2016-05-14: qty 100

## 2016-05-14 MED ORDER — METOPROLOL TARTRATE 5 MG/5ML IV SOLN
2.5000 mg | INTRAVENOUS | Status: DC | PRN
  Administered 2016-05-14: 2.5 mg via INTRAVENOUS

## 2016-05-14 MED ORDER — MIDAZOLAM HCL 2 MG/2ML IJ SOLN
INTRAMUSCULAR | Status: AC
  Administered 2016-05-14: 2 mg
  Filled 2016-05-14: qty 4

## 2016-05-14 MED ORDER — VITAL HIGH PROTEIN PO LIQD
1000.0000 mL | ORAL | Status: DC
  Administered 2016-05-14 – 2016-05-15 (×2): 1000 mL
  Filled 2016-05-14 (×2): qty 1000

## 2016-05-14 MED ORDER — FENTANYL CITRATE (PF) 100 MCG/2ML IJ SOLN
50.0000 ug | Freq: Once | INTRAMUSCULAR | Status: AC
  Administered 2016-05-14: 50 ug via INTRAVENOUS

## 2016-05-14 MED ORDER — ASPIRIN 81 MG PO CHEW
81.0000 mg | CHEWABLE_TABLET | Freq: Every evening | ORAL | Status: DC
  Administered 2016-05-14 – 2016-05-16 (×3): 81 mg
  Filled 2016-05-14 (×3): qty 1

## 2016-05-14 MED ORDER — LEVALBUTEROL HCL 0.63 MG/3ML IN NEBU
0.6300 mg | INHALATION_SOLUTION | Freq: Three times a day (TID) | RESPIRATORY_TRACT | Status: DC
  Administered 2016-05-14 – 2016-05-17 (×11): 0.63 mg via RESPIRATORY_TRACT
  Filled 2016-05-14 (×10): qty 3

## 2016-05-14 MED ORDER — AMIODARONE HCL IN DEXTROSE 360-4.14 MG/200ML-% IV SOLN
60.0000 mg/h | INTRAVENOUS | Status: AC
  Administered 2016-05-14: 60 mg/h via INTRAVENOUS
  Filled 2016-05-14: qty 200

## 2016-05-14 MED ORDER — MIDAZOLAM HCL 2 MG/2ML IJ SOLN
1.0000 mg | INTRAMUSCULAR | Status: DC | PRN
  Administered 2016-05-14 – 2016-05-16 (×3): 1 mg via INTRAVENOUS
  Filled 2016-05-14 (×4): qty 2

## 2016-05-14 MED ORDER — FENTANYL BOLUS VIA INFUSION
25.0000 ug | INTRAVENOUS | Status: DC | PRN
  Filled 2016-05-14: qty 25

## 2016-05-14 MED ORDER — TIOTROPIUM BROMIDE MONOHYDRATE 18 MCG IN CAPS
18.0000 ug | ORAL_CAPSULE | Freq: Every day | RESPIRATORY_TRACT | Status: DC
  Filled 2016-05-14: qty 5

## 2016-05-14 MED ORDER — IPRATROPIUM BROMIDE 0.02 % IN SOLN
0.5000 mg | Freq: Four times a day (QID) | RESPIRATORY_TRACT | Status: DC
  Administered 2016-05-14 – 2016-05-16 (×7): 0.5 mg via RESPIRATORY_TRACT
  Filled 2016-05-14 (×7): qty 2.5

## 2016-05-14 MED ORDER — DIGOXIN 125 MCG PO TABS
0.2500 mg | ORAL_TABLET | Freq: Every day | ORAL | Status: AC
  Administered 2016-05-14: 0.25 mg via ORAL
  Filled 2016-05-14: qty 2

## 2016-05-14 MED ORDER — SODIUM CHLORIDE 0.9 % IV SOLN
25.0000 ug/h | INTRAVENOUS | Status: DC
  Administered 2016-05-14: 25 ug/h via INTRAVENOUS
  Administered 2016-05-15: 75 ug/h via INTRAVENOUS
  Administered 2016-05-15: 150 ug/h via INTRAVENOUS
  Administered 2016-05-16 – 2016-05-17 (×2): 200 ug/h via INTRAVENOUS
  Filled 2016-05-14 (×6): qty 50

## 2016-05-14 MED ORDER — DOCUSATE SODIUM 50 MG/5ML PO LIQD: 100.0000 mg | Freq: Two times a day (BID) | ORAL | Status: DC | PRN

## 2016-05-14 MED ORDER — DIGOXIN 125 MCG PO TABS
0.1250 mg | ORAL_TABLET | Freq: Every day | ORAL | Status: DC
  Administered 2016-05-15: 0.125 mg via ORAL
  Filled 2016-05-14: qty 1

## 2016-05-14 MED ORDER — LEVALBUTEROL HCL 0.63 MG/3ML IN NEBU: 0.6300 mg | INHALATION_SOLUTION | Freq: Four times a day (QID) | RESPIRATORY_TRACT | Status: DC | PRN

## 2016-05-14 MED ORDER — HEPARIN (PORCINE) IN NACL 100-0.45 UNIT/ML-% IJ SOLN
1100.0000 [IU]/h | INTRAMUSCULAR | Status: DC
  Administered 2016-05-14: 1100 [IU]/h via INTRAVENOUS
  Filled 2016-05-14: qty 250

## 2016-05-14 MED FILL — Medication: Qty: 1 | Status: AC

## 2016-05-14 NOTE — Consult Note (Signed)
Reason for Consult:atrial flutter with 2 to one block/minimally elevated troponin Referring Physician:[facility] hospitalist  Howard Dixon is an 71 y.o. male.  NTI:RWERXVQ is 71 year old male with past medical history significant for multiple medical problems, I.e., coronary artery disease, history of MI in the past with chronically occluded.  One artery and 85% blockage in another, not amendable for PCI as per family, history of congestive heart failure secondary to depressed LV systolic function, hypertension, diabetes mellitus, COPD, tobacco abuse, continues to smoke, history of thoracic aneurysm clot.  The candidate for surgery per family evaluated at [facility], was admitted on 05/13/2016 because of progressive increasing shortness of breath associated with leg swelling and the chest pain and abdominal pain..  Cardiologic consultation was called as patient went into atrial flutter with 2 to one block.  Last night and was noted to have nonsustained VT.  Patient received IV amiodarone bolus and was started on drip without much improvement in the heart rate.  Patient was also noted to have minimally elevated troponin I, which was attributed to atrial flutter with RVR.  Patient received 2.5 mg of IV metoprolol with spontaneous conversion to sinus rhythm but also dropped his BP in 60s and became unresponsive requiring intubation to protect his airways.as per nurse's staff.  Patient has been tachycardic since last night associated with diaphoresis.  Patient was started on levofed with improvement in his blood pressure in the 008Q systolic  Past Medical History:  Diagnosis Date  . Allergic rhinitis due to pollen   . COPD (chronic obstructive pulmonary disease) ([place])   . Coronary artery disease, occlusive   . GERD (gastroesophageal reflux disease)   . Hyperlipidemia   . Hypertension   . Type II or unspecified type diabetes mellitus with neurological manifestations, not stated as uncontrolled(250.60)      Past Surgical History:  Procedure Laterality Date  . BACK SURGERY    . HERNIA REPAIR    . LUMBAR LAMINECTOMY/DECOMPRESSION MICRODISCECTOMY Left 07/29/2015   Procedure: Left Lumbar two-three microdiscectomy;  Surgeon: Kevan Ny Ditty, MD;  Location: [facility];  Service: Neurosurgery;  Laterality: Left;    Family History  Problem Relation Age of Onset  . Diabetes Mother   . Hypertension Mother   . Diabetes Father   . Heart disease Father   . Hypertension Father   . Diabetes Sister   . Hypertension Sister   . Diabetes Brother   . Heart disease Brother   . Hypertension Brother   . Diabetes Brother   . Hypertension Brother   . Diabetes Brother   . Heart disease Brother   . Kidney disease Brother   . Hypertension Brother   . Cancer Neg Hx     Social History:  reports that he has been smoking Cigarettes.  He started smoking about 54 years ago. He has been smoking about 1.00 pack per day. He has never used smokeless tobacco. He reports that he does not drink alcohol or use drugs.  Allergies:  Allergies  Allergen Reactions  . Sulfasalazine Rash    Medications: I have reviewed the patient's current medications.  Results for orders placed or performed during the hospital encounter of 04/28/2016 (from the past 48 hour(s))  Glucose, capillary     Status: Abnormal   Collection Time: 05/12/16  5:07 PM  Result Value Ref Range   Glucose-Capillary 192 (H) 65 - 99 mg/dL  Glucose, capillary     Status: Abnormal   Collection Time: 05/12/16 10:13 PM  Result  Value Ref Range   Glucose-Capillary 222 (H) 65 - 99 mg/dL  Basic metabolic panel     Status: Abnormal   Collection Time: 05/13/16  5:29 AM  Result Value Ref Range   Sodium 131 (L) 135 - 145 mmol/L   Potassium 3.3 (L) 3.5 - 5.1 mmol/L   Chloride 94 (L) 101 - 111 mmol/L   CO2 25 22 - 32 mmol/L   Glucose, Bld 155 (H) 65 - 99 mg/dL   BUN 51 (H) 6 - 20 mg/dL   Creatinine, Ser 1.57 (H) 0.61 - 1.24 mg/dL   Calcium 9.2 8.9 -  10.3 mg/dL   GFR calc non Af Amer 43 (L) >60 mL/min   GFR calc Af Amer 50 (L) >60 mL/min    Comment: (NOTE) The eGFR has been calculated using the CKD EPI equation. This calculation has not been validated in all clinical situations. eGFR's persistently <60 mL/min signify possible Chronic Kidney Disease.    Anion gap 12 5 - 15  Glucose, capillary     Status: Abnormal   Collection Time: 05/13/16  7:30 AM  Result Value Ref Range   Glucose-Capillary 164 (H) 65 - 99 mg/dL  Glucose, capillary     Status: Abnormal   Collection Time: 05/13/16 11:39 AM  Result Value Ref Range   Glucose-Capillary 161 (H) 65 - 99 mg/dL  Glucose, capillary     Status: Abnormal   Collection Time: 05/13/16  5:06 PM  Result Value Ref Range   Glucose-Capillary 211 (H) 65 - 99 mg/dL  Glucose, capillary     Status: Abnormal   Collection Time: 05/13/16  8:52 PM  Result Value Ref Range   Glucose-Capillary 195 (H) 65 - 99 mg/dL  Comprehensive metabolic panel     Status: Abnormal   Collection Time: 05/14/16  4:55 AM  Result Value Ref Range   Sodium 128 (L) 135 - 145 mmol/L   Potassium 4.2 3.5 - 5.1 mmol/L    Comment: DELTA CHECK NOTED NO VISIBLE HEMOLYSIS    Chloride 92 (L) 101 - 111 mmol/L   CO2 22 22 - 32 mmol/L   Glucose, Bld 193 (H) 65 - 99 mg/dL   BUN 43 (H) 6 - 20 mg/dL   Creatinine, Ser 1.69 (H) 0.61 - 1.24 mg/dL   Calcium 9.4 8.9 - 10.3 mg/dL   Total Protein 7.2 6.5 - 8.1 g/dL   Albumin 3.8 3.5 - 5.0 g/dL   AST 432 (H) 15 - 41 U/L   ALT 1,060 (H) 17 - 63 U/L   Alkaline Phosphatase 66 38 - 126 U/L   Total Bilirubin 3.9 (H) 0.3 - 1.2 mg/dL   GFR calc non Af Amer 40 (L) >60 mL/min   GFR calc Af Amer 46 (L) >60 mL/min    Comment: (NOTE) The eGFR has been calculated using the CKD EPI equation. This calculation has not been validated in all clinical situations. eGFR's persistently <60 mL/min signify possible Chronic Kidney Disease.    Anion gap 14 5 - 15  MRSA PCR Screening     Status: None    Collection Time: 05/14/16  6:40 AM  Result Value Ref Range   MRSA by PCR NEGATIVE NEGATIVE    Comment:        The GeneXpert MRSA Assay (FDA approved for NASAL specimens only), is one component of a comprehensive MRSA colonization surveillance program. It is not intended to diagnose MRSA infection nor to guide or monitor treatment for MRSA infections.   Glucose,  capillary     Status: Abnormal   Collection Time: 05/14/16  9:23 AM  Result Value Ref Range   Glucose-Capillary 199 (H) 65 - 99 mg/dL   Comment 1 Notify RN    Comment 2 Document in Chart   CBC     Status: Abnormal   Collection Time: 05/14/16 10:07 AM  Result Value Ref Range   WBC 10.9 (H) 4.0 - 10.5 K/uL   RBC 4.03 (L) 4.22 - 5.81 MIL/uL   Hemoglobin 13.3 13.0 - 17.0 g/dL   HCT 38.5 (L) 39.0 - 52.0 %   MCV 95.5 78.0 - 100.0 fL   MCH 33.0 26.0 - 34.0 pg   MCHC 34.5 30.0 - 36.0 g/dL   RDW 15.0 11.5 - 15.5 %   Platelets 123 (L) 150 - 400 K/uL  Protime-INR     Status: Abnormal   Collection Time: 05/14/16 10:07 AM  Result Value Ref Range   Prothrombin Time 18.5 (H) 11.4 - 15.2 seconds   INR 1.53   Blood gas, arterial     Status: Abnormal   Collection Time: 05/14/16 12:12 PM  Result Value Ref Range   FIO2 1.00    Delivery systems OXYGEN MASK    pH, Arterial 7.463 (H) 7.350 - 7.450   pCO2 arterial 20.1 (L) 32.0 - 48.0 mmHg   pO2, Arterial 531 (H) 83.0 - 108.0 mmHg   Bicarbonate 14.2 (L) 20.0 - 28.0 mmol/L   Acid-base deficit 7.4 (H) 0.0 - 2.0 mmol/L   O2 Saturation ABOVE REPORTABLE RANGE %    Comment: RBV DR MADERA BY ANGIE DUNLAP RRT RCP ON 05/14/16 AT 1214   Patient temperature 98.6    Collection site BRACHIAL ARTERY    Drawn by COLLECTED BY RT    Sample type ARTERIAL DRAW     Dg Chest Port 1 View  Result Date: 05/14/2016 CLINICAL DATA:  Shortness of breath.  Cough.  COPD. EXAM: PORTABLE CHEST 1 VIEW COMPARISON:  05/19/2016 FINDINGS: The cardiac silhouette is mildly enlarged. Aortic atherosclerosis is again  noted. There is chronic peribronchial thickening which has slightly decreased. There is minimal asymmetric opacity in the left lung base which does not appear increased from the prior study. There is blunting of the left lateral costophrenic sulcus which may reflect a new small pleural effusion. Subcentimeter nodular density in the right lung base is unchanged. No pneumothorax. Mild thoracic spondylosis. Old left clavicle fracture. IMPRESSION: 1. Persistent mild left basilar opacity which may reflect atelectasis although early infection is not excluded. 2. Possible small left pleural effusion. 3. COPD. 4. Aortic atherosclerosis. Electronically Signed   By: Logan Bores M.D.   On: 05/14/2016 12:16    Review of Systems  Unable to perform ROS: Intubated   Blood pressure (!) 88/66, pulse (!) 144, temperature (!) 96.5 F (35.8 C), temperature source Axillary, resp. rate (!) 26, height 5' 6"  (1.676 m), weight 166 lb 9.6 oz (75.6 kg), SpO2 99 %. Physical Exam  Eyes: Conjunctivae are normal. No scleral icterus.  Neck: Neck supple. No JVD present. No tracheal deviation present. No thyromegaly present.  Cardiovascular: Normal rate and regular rhythm.   Murmur (2/6 systolic murmur and faint diastolic murmur noted) heard. Respiratory:  Decreased breath sounds at bases  GI: Soft. Bowel sounds are normal. He exhibits no distension. There is no tenderness. There is no rebound and no guarding.  Musculoskeletal: He exhibits no edema, tenderness or deformity.    Assessment/Plan: Hypotensive shock secondary to a flutter with 2  to one block precipitated by 2.5 mg of Lopressor Status post a flutter with 2 to one block, converted to sinus rhythm. Status post nonsustained VT Acute respiratory failure secondary to above. COPD Coronary artery disease, history of MI in the past. Multivessel CAD. Aortic stenosis Ischemic cardiomyopathy. Hypertension. Diabetes mellitus. Tobacco abuse. Thoracic aneurysm. Chronic  kidney disease stage III. Elevated LFTs secondary to shock liver Plan Check serial enzymes and EKG Switch IV amiodarone to by mouth once patient is extubated and able to tolerate by mouth.Check old records from [place] Discussed briefly with the family regarding left cardiac catheterization once he is stable and extubated and staging the procedure in view of his chronic kidney disease, its risk and benefits and agrees. Wean off Levophed as blood pressure tolerates  Will start  beta blockers and ACE inhibitor once off inotrope and as blood pressure and renal function tolerates Add statins Dr.Kadakia on call for weekend Charolette Forward 05/14/2016, 1:12 PM

## 2016-05-14 NOTE — Progress Notes (Signed)
Patient's heart rate continues in the 150's to 160's after Amiodarone given. Blood pressure now 88/66. NP on call was notified and new orders received to start Amiodarone drip and to transfer patient to step down.  Will continue to monitor patient.

## 2016-05-14 NOTE — Progress Notes (Signed)
  ANTICOAGULATION CONSULT NOTE - Initial Consult  Pharmacy Consult for IV heparin Indication: atrial fibrillation  Allergies  Allergen Reactions  . Sulfasalazine Rash    Patient Measurements: Height: 5\' 6"  (167.6 cm) Weight: 166 lb 9.6 oz (75.6 kg) IBW/kg (Calculated) : 63.8 Heparin Dosing Weight:   Vital Signs: Temp: 96.5 F (35.8 C) (02/23 0728) Temp Source: Axillary (02/23 0728) BP: 88/66 (02/23 0527) Pulse Rate: 144 (02/23 0935)  Labs:  Recent Labs  05/04/2016 1255 05/10/2016 1743 05/14/2016 2316 05/12/16 0528 05/13/16 0529 05/14/16 0455 05/14/16 1007  HGB  --   --   --   --   --   --  13.3  HCT  --   --   --   --   --   --  38.5*  PLT  --   --   --   --   --   --  123*  LABPROT  --   --   --   --   --   --  18.5*  INR  --   --   --   --   --   --  1.53  CREATININE  --   --   --  2.08* 1.57* 1.69*  --   CKTOTAL 612*  --   --   --   --   --   --   TROPONINI 0.10* 0.17* 0.16*  --   --   --   --     Estimated Creatinine Clearance: 37.2 mL/min (by C-G formula based on SCr of 1.69 mg/dL (H)).   Medical History: Past Medical History:  Diagnosis Date  . Allergic rhinitis due to pollen   . COPD (chronic obstructive pulmonary disease) ([facility])   . Coronary artery disease, occlusive   . GERD (gastroesophageal reflux disease)   . Hyperlipidemia   . Hypertension   . Type II or unspecified type diabetes mellitus with neurological manifestations, not stated as uncontrolled(250.60)     Medications:  Scheduled:  . aspirin EC  81 mg Oral QPM  . cholecalciferol  1,000 Units Oral Daily  . [START ON 05/15/2016] digoxin  0.125 mg Oral Daily  . fluticasone  1 spray Each Nare Daily  . furosemide  60 mg Oral Daily  . insulin aspart  0-9 Units Subcutaneous TID WC  . ipratropium  0.5 mg Nebulization QID  . levalbuterol  0.63 mg Nebulization TID  . pantoprazole  40 mg Oral Daily  . potassium chloride  40 mEq Oral Daily  . sodium chloride flush  3 mL Intravenous Q12H     Assessment: Pharmacy is consulted to dose heparin drip in 71 yo male newly diagnosed with atrial flutter. Pt also started on digoxin and amiodarone. troponins also elevated.   Today, 05/14/16  Hgb 13.3, plt 123  INR 1.53, PT 18.5  SCr 1.69, CrCl 37 ml/min    Goal of Therapy:  Heparin level 0.3-0.7 units/ml Monitor platelets by anticoagulation protocol: Yes   Plan:   Per consult no bolus, so will start heparin at 1100 units/hr.  Monitor for signs and symptoms of bleeding  Heparin level 6 hours after infusion start.   Adalberto ColeNikola Lataysha Vohra, PharmD, BCPS Pager [phone] 05/14/2016 11:43 AM

## 2016-05-14 NOTE — Progress Notes (Signed)
  Date: May 14, 2016 Will follow case for patient changes, chart updates and any case management needs. Marcelle Smilinghonda Aerin Delany, RN, BSN, [place]   [phone]

## 2016-05-14 NOTE — Progress Notes (Signed)
eLink Physician-Brief Progress Note Patient Name: Howard RidgeJohn W Dixon DOB: 03/04/1946 MRN: [id number]   Date of Service  05/14/2016  HPI/Events of Note  Oliguria - residual = >200 mL on Bladder Scan.   eICU Interventions  Will order: 1. Place Foley catheter.      Intervention Category Intermediate Interventions: Oliguria - evaluation and management  Brenlyn Beshara Eugene 05/14/2016, 6:41 PM

## 2016-05-14 NOTE — Procedures (Signed)
Intubation Procedure Note TAMMIE ELLSWORTH [id number] Feb 22, 1946  Procedure: Intubation Indications: Respiratory insufficiency  Procedure Details Consent: Unable to obtain consent because of emergent medical necessity. Time Out: Verified patient identification, verified procedure, site/side was marked, verified correct patient position, special equipment/implants available, medications/allergies/relevent history reviewed, required imaging and test results available.  Performed  Maximum sterile technique was used including gloves and hand hygiene.  Mac-4 Glidescope. 8.0 ETT placed under DL. Position confirmed by ETCO2, direct vis, auscultation.   Meds: fentanyl 100, versed 2, etomidate 20   Evaluation Hemodynamic Status: BP stable throughout; O2 sats: stable throughout Patient's Current Condition: stable Complications: No apparent complications Patient did tolerate procedure well. Chest X-ray ordered to verify placement.  CXR: pending.   Baltazar Apo, MD, PhD 05/14/2016, 1:02 PM [facility] [phone] or if no answer [phone]

## 2016-05-14 NOTE — Progress Notes (Addendum)
Patient became restless, diaphoretic,tachypneic and into acute resp distress. His BP also drop after low dose lopressor given with intentions to slow down his HR. I assumed patient developed cardiogenic shock and will need pressors. Also patient ended requiring intubation. PCCM has taken over at this moment. Cardiology on board. Will resume patient care once extubated.   -CXR:  1. Persistent mild left basilar opacity which may reflect atelectasis although early infection is not excluded. 2. Possible small left pleural effusion. 3. COPD. 4. Aortic atherosclerosis.  -ABG: PH- 7.469 / PCO2- 20.1 / O2- 531 / Bicarb- 14.2  Howard Dixon, Howard Dixon [phone]

## 2016-05-14 NOTE — Progress Notes (Signed)
PULMONARY / CRITICAL CARE MEDICINE   Name: Howard Dixon MRN: [id number] DOB: 1946/02/21    ADMISSION DATE:  05/16/2016 CONSULTATION DATE:  05/14/16  REFERRING MD:  Dr. Gwenlyn Perking   CHIEF COMPLAINT:  Respiratory distress, hypotension   HISTORY OF PRESENT ILLNESS:   71 y/o M who presented 2/20 to [facility] via EMS with complaints of lower epigastric pain, chest tightness, SOB, 1 episode of vomiting & constipation.   On admit, the patient reported he had attempted miralax without relief.  He also had noted increased lower extremity swelling and 5-6 lb weight gain.  He has a known hx of CAD, COPD, DM, former ETOH abuse (30 years ago), HTN, CHF (LVEF 25-30%), ongoing tobacco abuse and aortic aneurysm.  He had a low dose CT screen for lung cancer and the aneurysm was incidentally found > he is followed at [facility] for the same and has been told he would be a poor surgical candidate.  Initial work up included a negative EKG, troponin of 0.08, hyponatremia, elevated transaminases (AST 2,242 / ALT 2,232)  and AKI.  CT of the abdomen was evaluated and demonstrated an enlarged L lobe of liver concerning for early cirrhosis, gallbladder sludge vs small stones, 7 mm non-obstructing R renal stone without hydronephrosis, small ascites, sigmoid diverticulosis, no bowel obstruction & small R effusion.  He was admitted by [facility] for concerns of hepatorenal syndrome.  The patient was diuresed with improvement in renal function (4.2L negative balance for admit).  Hepatitis panel was evaluated and negative.  Overnight 2/23 he developed atrial flutter and was placed on amiodarone.  Cardiology was consulted.  Afternoon of 2/23, the patient became agitated and restless.  He also developed hypotension.  ABG assessed > 7.463 / 20 / 531 / 14.2.  The patient was treated with low dose beta blocker (lopressor 2.5mg ) for rate.  Unfortunately, the rate was difficult to control.  He acutely decompensated with near respiratory arrest.    PCCM  consulted for emergent intubation / evaluation.     PAST MEDICAL HISTORY :  He  has a past medical history of Allergic rhinitis due to pollen; COPD (chronic obstructive pulmonary disease) (HCC); Coronary artery disease, occlusive; GERD (gastroesophageal reflux disease); Hyperlipidemia; Hypertension; and Type II or unspecified type diabetes mellitus with neurological manifestations, not stated as uncontrolled(250.60).  PAST SURGICAL HISTORY: He  has a past surgical history that includes Hernia repair; Back surgery; and Lumbar laminectomy/decompression microdiscectomy (Left, 07/29/2015).  Allergies  Allergen Reactions  . Sulfasalazine Rash    No current facility-administered medications on file prior to encounter.    Current Outpatient Prescriptions on File Prior to Encounter  Medication Sig  . albuterol (PROVENTIL HFA;VENTOLIN HFA) 108 (90 Base) MCG/ACT inhaler Inhale 2 puffs into the lungs 4 (four) times daily as needed. (Patient taking differently: Inhale 2 puffs into the lungs 4 (four) times daily as needed for wheezing or shortness of breath. )  . aspirin 81 MG tablet Take 81 mg by mouth every evening.   Marland Kitchen atorvastatin (LIPITOR) 10 MG tablet Take 10 mg by mouth daily at 6 PM.   . BD ULTRA-FINE LANCETS lancets 1 each by Other route. Use as instructed  . Cholecalciferol (VITAMIN D-3) 1000 UNITS CAPS Take 1,000 Units by mouth daily.   Marland Kitchen docusate sodium (COLACE) 100 MG capsule Take 1 capsule (100 mg total) by mouth daily as needed for mild constipation.  . flunisolide (NASAREL) 29 MCG/ACT (0.025%) nasal spray Place 2 sprays into the nose 2 (two) times  daily. Dose is for each nostril.  . furosemide (LASIX) 40 MG tablet Take 40 mg by mouth daily as needed for fluid or edema.   Marland Kitchen glipiZIDE (GLUCOTROL) 10 MG tablet Take 10 mg by mouth 2 (two) times daily before a meal.  . glucose blood (PRECISION XTRA TEST STRIPS) test strip 1 each by Other route daily. Use as instructed   . hydrocerin  (EUCERIN) CREA Apply 1 application topically 2 (two) times daily as needed (dry skin).   Marland Kitchen lisinopril (PRINIVIL,ZESTRIL) 10 MG tablet Take 1 tablet (10 mg total) by mouth daily. (Patient taking differently: Take 5 mg by mouth daily. )  . loratadine (CLARITIN) 10 MG tablet Take 10 mg by mouth daily.  . metFORMIN (GLUCOPHAGE) 1000 MG tablet Take 1 tablet (1,000 mg total) by mouth 2 (two) times daily with a meal.  . metoprolol (LOPRESSOR) 50 MG tablet Take 50 mg by mouth every morning.   Marland Kitchen omeprazole (PRILOSEC) 20 MG capsule Take 20 mg by mouth daily.  . ranitidine (ZANTAC) 150 MG capsule Take 75 mg by mouth 2 (two) times daily.   . polyethylene glycol (MIRALAX / GLYCOLAX) packet Take 17 g by mouth daily. (Patient not taking: Reported on 05/01/2016)    FAMILY HISTORY:  His indicated that his mother is deceased. He indicated that his father is deceased. He indicated that his sister is deceased. He indicated that all of his three brothers are deceased. He indicated that the status of his neg hx is unknown.    SOCIAL HISTORY: He  reports that he has been smoking Cigarettes.  He started smoking about 54 years ago. He has been smoking about 1.00 pack per day. He has never used smokeless tobacco. He reports that he does not drink alcohol or use drugs.  REVIEW OF SYSTEMS:   Unable to complete as patient is altered / on mechanical ventilation.   SUBJECTIVE:   VITAL SIGNS: BP (!) 88/66 (BP Location: Left Arm)   Pulse (!) 144   Temp (!) 96.5 F (35.8 C) (Axillary)   Resp (!) 26   Ht 5\' 6"  (1.676 m)   Wt 166 lb 9.6 oz (75.6 kg)   SpO2 99%   BMI 26.89 kg/m   HEMODYNAMICS:    VENTILATOR SETTINGS: Vent Mode: PRVC FiO2 (%):  [100 %] 100 % Set Rate:  [14 bmp] 14 bmp PEEP:  [5 cmH20] 5 cmH20 Plateau Pressure:  [16 cmH20] 16 cmH20  INTAKE / OUTPUT: I/O last 3 completed shifts: In: 480 [P.O.:480] Out: 2750 [Urine:2750]  PHYSICAL EXAMINATION: General:  Chronically ill appearing male on  vent Neuro:  Sedate on vent  HEENT:  ETT, mm pink/dry, telangectasia on cheeks Cardiovascular:  s1s2 irr irr Lungs:  Non-labored on vent, lungs bilaterally coarse Abdomen:  Obese/soft, bsx4 active  Musculoskeletal:  No acute deformities  Skin:  Warm/dry, no edema   LABS:  BMET  Recent Labs Lab 05/12/16 0528 05/13/16 0529 05/14/16 0455  NA 130* 131* 128*  K 3.9 3.3* 4.2  CL 94* 94* 92*  CO2 25 25 22   BUN 67* 51* 43*  CREATININE 2.08* 1.57* 1.69*  GLUCOSE 150* 155* 193*    Electrolytes  Recent Labs Lab 05/01/2016 1255 05/12/16 0528 05/13/16 0529 05/14/16 0455  CALCIUM  --  9.3 9.2 9.4  MG 2.5*  --   --   --     CBC  Recent Labs Lab 05/16/2016 0045 05/14/16 1007  WBC 12.3* 10.9*  HGB 12.7* 13.3  HCT 36.5* 38.5*  PLT 144* 123*    Coag's  Recent Labs Lab 06-04-2016 0650 05/14/16 1007  INR 1.79 1.53    Sepsis Markers No results for input(s): LATICACIDVEN, PROCALCITON, O2SATVEN in the last 168 hours.  ABG  Recent Labs Lab 05/14/16 1212  PHART 7.463*  PCO2ART 20.1*  PO2ART 531*    Liver Enzymes  Recent Labs Lab 06/04/16 0045 05/14/16 0455  AST 2,242* 432*  ALT 2,232* 1,060*  ALKPHOS 74 66  BILITOT 2.6* 3.9*  ALBUMIN 4.1 3.8    Cardiac Enzymes  Recent Labs Lab 2016/06/04 1743 June 04, 2016 2316 05/14/16 1204  TROPONINI 0.17* 0.16* 0.12*    Glucose  Recent Labs Lab 05/12/16 2213 05/13/16 0730 05/13/16 1139 05/13/16 1706 05/13/16 2052 05/14/16 0923  GLUCAP 222* 164* 161* 211* 195* 199*    Imaging Dg Chest Port 1 View  Result Date: 05/14/2016 CLINICAL DATA:  Shortness of breath.  Cough.  COPD. EXAM: PORTABLE CHEST 1 VIEW COMPARISON:  2016-06-04 FINDINGS: The cardiac silhouette is mildly enlarged. Aortic atherosclerosis is again noted. There is chronic peribronchial thickening which has slightly decreased. There is minimal asymmetric opacity in the left lung base which does not appear increased from the prior study. There is  blunting of the left lateral costophrenic sulcus which may reflect a new small pleural effusion. Subcentimeter nodular density in the right lung base is unchanged. No pneumothorax. Mild thoracic spondylosis. Old left clavicle fracture. IMPRESSION: 1. Persistent mild left basilar opacity which may reflect atelectasis although early infection is not excluded. 2. Possible small left pleural effusion. 3. COPD. 4. Aortic atherosclerosis. Electronically Signed   By: Sebastian Ache M.D.   On: 05/14/2016 12:16     STUDIES:  2/20  CT ABD/Pelvis >> enlarged L lobe of liver concerning for early cirrhosis, gallbladder sludge vs small stones, 7 mm non-obstructing R renal stone without hydronephrosis, small ascites, sigmoid diverticulosis, no bowel obstruction, small R effusion 2/20 LE Duplex >> negative for DVT 2/21 ECHO >> LV mildly dilated, LVEF 20%, diffuse hypokinesis, no LV thrombus, grade II diastolic dysfunction, moderate AS, PA Peak 56   CULTURES: BCx2 2/20 >> 1/2 with coag neg staph  ANTIBIOTICS:   SIGNIFICANT EVENTS: 2/20  Admit with sCHF  2/23  PCCM consulted for hypotension, AMS, near resp arrest  LINES/TUBES: ETT 2/23 >> R IJ TLC 2/23 >>  DISCUSSION: 72 y/o M admitted with abdominal pain, SOB. Work up concerning for decompensation of sCHF, possible hepatorenal syndrome.  Developed atrial flutter and later acutely decompensated 2/23, intubated.     ASSESSMENT / PLAN:  PULMONARY A: Acute Respiratory Insufficiency - in setting of AMS, decompensated sCHF Interstitial Edema / CHF  Pulmonary Hypertension - PA Peak 56 on ECHO  COPD Tobacco Abuse P:   PRVC 8 cc/kg  Wean PEEP / FiO2 for sats 88-95% Xopenex + Atrovent QID Rate control  CXR > reviewed images, pull ETT back 4 cm  Follow up ABG post intubation and in am   CARDIOVASCULAR A:  Atrial Flutter  Decompensated CHF, combined systolic / diastolic - LVEF decreased to 20% Atypical Chest Pain  LE Edema  Hx HTN P:  ICU  monitoring  Cardiology following Continue heparin gtt Trend troponin  Amiodarone gtt Digoxin QD PRN lopressor   RENAL A:   AKI on CKD III - likely in setting of CHF, improved initially with diuresis  Hyponatremia  Hyper/hypokalemia  P:   Trend BMP / UOP  Replace electrolytes as indicated   GASTROINTESTINAL A:   Transaminitis - suspect related  to hx of remote ETOH & congestive hepatopathy.  CMV DNA negative  P:   NPO Begin TF  PPI for SUP  Trend LFT's  Hold home statin  Follow up EBF DNA >  HEMATOLOGIC A:   Mild Thrombocytopenia - suspect secondary to liver disease  P:  Trend CBC  Monitor for bleeding  Continue heparin gtt per pharmacy   INFECTIOUS A:   No acute infectious process P:   Monitor fever curve / WBC  Monitor off abx   ENDOCRINE A:   DM II  P:   SSI  Hold home glipizide and metformin   NEUROLOGIC A:   Acute Metabolic Encephalopathy  P:   RASS goal: 0 to -1 PRN versed for sedation  PRN fentanyl for pain    FAMILY  - Updates: Family updated at bedside per Dr. Gwenlyn PerkingMadera 2/23.    - Inter-disciplinary family meet or Palliative Care meeting due by:  05/21/16   NP CC Time: 35 minutes   Canary BrimBrandi Ollis, NP-C [facility] Pgr: [phone] or if no answer [phone] 05/14/2016, 3:05 PM  Attending Note:  I have examined patient, reviewed labs, studies and notes. I have discussed the case with B Ollis, and I agree with the data and plans as amended above.   71 yo man with hx CAD and ischemic CM, DM, COPD, TAA. Admitted with abd pain, nausea, transaminitis and acute renal insufficiency on 2/20. He underwent diuresis with some improvement in overall status, stable renal fxn. TTE revealed a decrease in LVEF to 20% with diffuse hypokinesis (from 25-35% prior). 2/22 pm he developed A Fib / flutter with RVR, received amiodarone. He tolerated HR poorly and developed relative hypotension as well as restlessness, diaphoresis, dyspnea. He was  given metoprolol for rate control on 2/23 mid-day and acutely decompensated, hypotension to 60-70's systolic, loss of his mental status. On my initial evaluation he was barely responsive, attempts to speak but is incoherent, tachypneic, cool and in resp distress. SpO2 will not pick up accurately. Lungs distant without wheezing or crackles. Abdomen benign. IVF bolus and then norepi were started with some improvement in wakefulness but with persistent instability. Decision was made to intubate and ventilate. He converted to sinus rhythm but remained on pressors. We will plan to ventilate, use heparin and amiodarone gtt's. Support BP with norepi. Diuresis if / when BP will support it. Discussed case with Dr Sharyn LullHarwani at bedside - will follow serial troponin. Suspect that he will go for cath next week once stabilized. We will follow LFT, hold statin for now. CT abd suggestive of possible early cirrhotic changes. He has a R effusion that may need to be sampled once stabilized.   Independent critical care time is 40 minutes.   Levy Pupaobert Myelle Poteat, MD, PhD 05/14/2016, 3:47 PM  Pulmonary and Critical Care [phone] or if no answer [phone]

## 2016-05-14 NOTE — Procedures (Signed)
Central Venous Catheter Insertion Procedure Note Howard Dixon [id number] 12-06-1945  Procedure: Insertion of Central Venous Catheter Indications: Assessment of intravascular volume, Drug and/or fluid administration and Frequent blood sampling  Procedure Details Consent: Risks of procedure as well as the alternatives and risks of each were explained to the (patient/caregiver).  Consent for procedure obtained. Time Out: Verified patient identification, verified procedure, site/side was marked, verified correct patient position, special equipment/implants available, medications/allergies/relevent history reviewed, required imaging and test results available.  Performed  Maximum sterile technique was used including antiseptics, cap, gloves, gown, hand hygiene, mask and sheet. Skin prep: Chlorhexidine; local anesthetic administered A antimicrobial bonded/coated triple lumen catheter was placed in the right internal jugular vein using the Seldinger technique.  Evaluation Blood flow good Complications: No apparent complications Patient did tolerate procedure well. Chest X-ray ordered to verify placement.  CXR: pending.  Attempted L IJ Placement but unable to pass wire despite adequate blood flow.   Procedure performed under direct supervision of Dr. Delton CoombesByrum and with ultrasound guidance for real time vessel cannulation.     Howard Howard Odelle Kosier, NP-C [facility] Pgr: [phone] or if no answer [phone] 05/14/2016, 2:18 PM

## 2016-05-14 NOTE — Progress Notes (Signed)
Chaplain referred by nursing c/t changes in pt condition  Provided support with pt family in ICU.  Pt's spouse, daughter, grandson present.    One grandson in [employer]  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   Family has not informed grandson that pt is hospitalized, as he is two weeks from completing basic and they do not wish to compromise his ability to complete training.   They are considering when to tell grandson, chaplain recommended [place]plain on [place] to inform.

## 2016-05-14 NOTE — Progress Notes (Signed)
[facility] PROGRESS NOTE  TESLA BOCHICCHIO QIO:[id number] DOB: 29-Jul-1945 DOA: 05/12/2016 PCP: Tillman Abide, MD  Interim summary and HPI 71 y.o. male with medical history of systolic CHF with EF 25-30 percent, coronary artery disease with CTO of pLAD, COPD with continued tobacco abuse, hypertension, diabetes mellitus, descending aortic aneurysm presenting with one-week history of abdominal pain and worsening shortness of breath. The patient states that eating and activity occasionally exacerbates his abdominal pain. He felt that his furosemide, which he was started on one month ago, caused worsening abdominal pain. As a result, he stopped taking it 1 week prior to this admission. Since then, the patient has had increasing shortness of breath and coughing. The patient has had increasing lower extremity edema in the past week with 5-6 pound weight gain.  Assessment/Plan: Acute on chronic combined systolic and diastolic CHF -EF 20 percent on repeat echo; diffuse hypokinesis and grade 2 diastolic HF. -continue daily weights and strict I/O's -low sodium diet -continue Lasix PO  Transaminasemia -suspect hepatic congestion from CHF -acetaminophen level negatve -Viral hepatitis panel negative -trend LFTs -CMV DNA, negative -EBV DNA, pending -d/c statin for now -LFT's improving  Acute on chronic renal failure--CKD 3 at baseline -monitor with diuresis -holding lisinopril -due to decrease perfusion with acute CHF -Cr 1.6 today; will monitor   Hyperkalemia -kayexalate x 1 given on admission  -continue telemetry monitoring  -d/c lisinopril and Kcl supplement -K 3.9 today  Atypical chest pain/mild elevation on troponin and A. flutter -due to CHF exacerbation most likely -troponin elevated from demand ischemia most likely -EKG without concerning ischemic changes; but with new A. flutter -continue ASA -given diffuse hypokinesis less likely to be CAD related; but because EF 20% will  consult cardiology service -CHADsVASC score is 4 -will start heparin drip, start digoxin and continue amiodarone drip  Hyponatremia -due to fluid overload and possible underlying renal failure -continue diuresis; now PO -Na  128-130  Leukocytosis -likely stress demargination -UA negative for UTI and CXR w/o infiltrates -blood cultures x 2 sets ordered; and 1/2 demonstrated coagulase neg staph (contamination) -no abd pain and no source of infection appreciated  COPD -He didn't qualify for home oxygen, but endorses he uses his wife's oxygen prn -feeling good on 2 L; but has proven not to needed. Will wean him off.  -continue atrovent and xopenex (now); no wheezing -will assess oxygen needs on exertion and RA  DM2 -novolog sliding scale -holding glipizide and metformin while inpatient -A1C 6.5  HTN -holding metoprolol due to soft BP -will monitor VS  Leg pain and edema -venous duplex neg for DVT -swelling secondary to CHF  Code Status: Full Family Communication: no family at bedside Disposition Plan: remains inpatient, will keep on stepdown now. Continue amiodarone drip and start digoxin. Cardiology has been consulted. Will start heparin drip.  Continue to Follow strict intake and output and follow renal function.   Consultants:  Cardiology   Procedures:  LE duplex: -Preliminary report:  Bilateral:  No evidence of DVT, superficial thrombosis, or Baker's Cyst.   2-D echo: - Left ventricle: The cavity size was mildly dilated. Wall   thickness was normal. The estimated ejection fraction was 20%.   Diffuse hypokinesis. No LV thrombus noted. Features are   consistent with a pseudonormal left ventricular filling pattern,   with concomitant abnormal relaxation and increased filling   pressure (grade 2 diastolic dysfunction). - Aortic valve: Trileaflet; moderately calcified leaflets. There   was moderate stenosis. There was mild regurgitation.  Mean   gradient (S):  21 mm Hg. Peak gradient (S): 32 mm Hg. Valve area   (VTI): 1.4 cm^2. - Aorta: Dilated aortic root and ascending aorta. Ascending aorta   dimension: 46 mm. Aortic root dimension: 44 mm (ED). - Mitral valve: There was no significant regurgitation. - Left atrium: The atrium was mildly dilated. - Right ventricle: Poorly visualized. The cavity size was normal.   Systolic function was mildly to moderately reduced. - Tricuspid valve: Peak RV-RA gradient (S): 41 mm Hg. - Pulmonary arteries: PA peak pressure: 56 mm Hg (S). - Systemic veins: IVC measured 2.6 cm with < 50% respirophasic   variation, suggesting RA pressure 15 mmHg.  Impressions: - Mildly dilated LV with EF 20%, diffuse hypokinesis. Moderate   diastolic dysfunction. Normal RV size with mild to moderately   decreased systolic function. Moderate aortic stenosis. Moderate   pulmonary hypertension. Dilated IVC suggestive of elevated RV   filling pressure.  Antibiotics:  None   HPI/Subjective: Reporting chest discomfort and mild SOB. Patient with irregular rate/rhythm on exam; requiring amiodarone drip. No fever.  Objective: Vitals:   05/14/16 0527 05/14/16 0700  BP: (!) 88/66   Pulse: (!) 156 (!) 163  Resp:  (!) 26  Temp:      Intake/Output Summary (Last 24 hours) at 05/14/16 0843 Last data filed at 05/14/16 1610  Gross per 24 hour  Intake              480 ml  Output             1050 ml  Net             -570 ml   Filed Weights   05/12/16 0432 05/13/16 0514 05/14/16 0527  Weight: 77.2 kg (170 lb 3.1 oz) 75.7 kg (166 lb 14.2 oz) 75.6 kg (166 lb 9.6 oz)    Exam:   General: afebrile, described some mild chest discomfort and SOB. Overnight developed no sustained episodes of VT and then developed sustained A. Flutter. HR remained in 140-150's range and patient has been started on Amiodarone drip and moved to stepdown.   Cardiovascular: no JVD see on exam, soft SEM, no rubs, no gallops; irregular  irregular  Respiratory: no frank crackles appreciated at bases; but with decrease breath sound. No wheezing, scattered rhonchi  Abdomen: soft, NT, positive BS  Musculoskeletal: trace edema bilaterally, no cyanosis   Data Reviewed: Basic Metabolic Panel:  Recent Labs Lab 04/26/2016 0045 05/16/2016 1255 05/12/16 0528 05/13/16 0529 05/14/16 0455  NA 126*  --  130* 131* 128*  K 5.7*  --  3.9 3.3* 4.2  CL 92*  --  94* 94* 92*  CO2 19*  --  25 25 22   GLUCOSE 63*  --  150* 155* 193*  BUN 72*  --  67* 51* 43*  CREATININE 2.31*  --  2.08* 1.57* 1.69*  CALCIUM 10.5*  --  9.3 9.2 9.4  MG  --  2.5*  --   --   --    Liver Function Tests:  Recent Labs Lab 05/09/2016 0045 05/14/16 0455  AST 2,242* 432*  ALT 2,232* 1,060*  ALKPHOS 74 66  BILITOT 2.6* 3.9*  PROT 7.8 7.2  ALBUMIN 4.1 3.8    Recent Labs Lab 04/25/2016 0045  LIPASE 41   CBC:  Recent Labs Lab 04/28/2016 0045  WBC 12.3*  NEUTROABS 9.6*  HGB 12.7*  HCT 36.5*  MCV 94.3  PLT 144*   Cardiac Enzymes:  Recent  Labs Lab June 02, 2016 1255 06-02-16 1743 2016/06/02 2316  CKTOTAL 612*  --   --   TROPONINI 0.10* 0.17* 0.16*   BNP (last 3 results)  Recent Labs  03/18/16 0448  BNP 923.0*   CBG:  Recent Labs Lab 05/12/16 2213 05/13/16 0730 05/13/16 1139 05/13/16 1706 05/13/16 2052  GLUCAP 222* 164* 161* 211* 195*    Recent Results (from the past 240 hour(s))  Blood Culture ID Panel (Reflexed)     Status: None   Collection Time: Jun 02, 2016  6:41 AM  Result Value Ref Range Status   Enterococcus species NOT DETECTED NOT DETECTED Final   Listeria monocytogenes NOT DETECTED NOT DETECTED Final   Staphylococcus species NOT DETECTED NOT DETECTED Final   Staphylococcus aureus NOT DETECTED NOT DETECTED Final   Streptococcus species NOT DETECTED NOT DETECTED Final   Streptococcus agalactiae NOT DETECTED NOT DETECTED Final   Streptococcus pneumoniae NOT DETECTED NOT DETECTED Final   Streptococcus pyogenes NOT DETECTED  NOT DETECTED Final   Acinetobacter baumannii NOT DETECTED NOT DETECTED Final   Enterobacteriaceae species NOT DETECTED NOT DETECTED Final   Enterobacter cloacae complex NOT DETECTED NOT DETECTED Final   Escherichia coli NOT DETECTED NOT DETECTED Final   Klebsiella oxytoca NOT DETECTED NOT DETECTED Final   Klebsiella pneumoniae NOT DETECTED NOT DETECTED Final   Proteus species NOT DETECTED NOT DETECTED Final   Serratia marcescens NOT DETECTED NOT DETECTED Final   Haemophilus influenzae NOT DETECTED NOT DETECTED Final   Neisseria meningitidis NOT DETECTED NOT DETECTED Final   Pseudomonas aeruginosa NOT DETECTED NOT DETECTED Final   Candida albicans NOT DETECTED NOT DETECTED Final   Candida glabrata NOT DETECTED NOT DETECTED Final   Candida krusei NOT DETECTED NOT DETECTED Final   Candida parapsilosis NOT DETECTED NOT DETECTED Final   Candida tropicalis NOT DETECTED NOT DETECTED Final    Comment: Performed at [facility], [address], [place], [place] [address]  Culture, blood (Routine X 2) w Reflex to ID Panel     Status: None (Preliminary result)   Collection Time: 06/02/2016  6:50 AM  Result Value Ref Range Status   Specimen Description BLOOD LEFT WRIST  Final   Special Requests BOTTLES DRAWN AEROBIC AND ANAEROBIC 10CC  Final   Culture   Final    NO GROWTH 2 DAYS Performed at [facility], [address], [place], [place] [address]    Report Status PENDING  Incomplete  Culture, blood (Routine X 2) w Reflex to ID Panel     Status: Abnormal (Preliminary result)   Collection Time: Jun 02, 2016  7:15 AM  Result Value Ref Range Status   Specimen Description BLOOD WRIST  Final   Special Requests BOTTLES DRAWN AEROBIC AND ANAEROBIC 10CC  Final   Culture  Setup Time   Final    GRAM POSITIVE COCCI IN CLUSTERS AEROBIC BOTTLE ONLY CRITICAL RESULT CALLED TO, READ BACK BY AND VERIFIED WITH: PHARMD E JACKSON [id number] MLM GRAM POSITIVE RODS ANAEROBIC BOTTLE ONLY CRITICAL RESULT  CALLED TO, READ BACK BY AND VERIFIED WITH: BETH GREEN,PHARMD @0528  022/22/18 MKELLY,MLT    Culture (A)  Final    STAPHYLOCOCCUS SPECIES (COAGULASE NEGATIVE) THE SIGNIFICANCE OF ISOLATING THIS ORGANISM FROM A SINGLE SET OF BLOOD CULTURES WHEN MULTIPLE SETS ARE DRAWN IS UNCERTAIN. PLEASE NOTIFY THE MICROBIOLOGY DEPARTMENT WITHIN ONE WEEK IF SPECIATION AND SENSITIVITIES ARE REQUIRED. Performed at [facility], [address] [place], [place] [address]    Report Status PENDING  Incomplete  Blood Culture ID Panel (Reflexed)     Status: Abnormal   Collection Time: 04/25/2016  7:15 AM  Result Value Ref Range Status   Enterococcus species NOT DETECTED NOT DETECTED Final   Listeria monocytogenes NOT DETECTED NOT DETECTED Final   Staphylococcus species DETECTED (A) NOT DETECTED Final    Comment: Methicillin (oxacillin) susceptible coagulase negative staphylococcus. Possible blood culture contaminant (unless isolated from more than one blood culture draw or clinical case suggests pathogenicity). No antibiotic treatment is indicated for blood  culture contaminants. CRITICAL RESULT CALLED TO, READ BACK BY AND VERIFIED WITH: PHARMD E JACKSON [phone] MLM    Staphylococcus aureus NOT DETECTED NOT DETECTED Final   Methicillin resistance NOT DETECTED NOT DETECTED Final   Streptococcus species NOT DETECTED NOT DETECTED Final   Streptococcus agalactiae NOT DETECTED NOT DETECTED Final   Streptococcus pneumoniae NOT DETECTED NOT DETECTED Final   Streptococcus pyogenes NOT DETECTED NOT DETECTED Final   Acinetobacter baumannii NOT DETECTED NOT DETECTED Final   Enterobacteriaceae species NOT DETECTED NOT DETECTED Final   Enterobacter cloacae complex NOT DETECTED NOT DETECTED Final   Escherichia coli NOT DETECTED NOT DETECTED Final   Klebsiella oxytoca NOT DETECTED NOT DETECTED Final   Klebsiella pneumoniae NOT DETECTED NOT DETECTED Final   Proteus species NOT DETECTED NOT DETECTED Final   Serratia  marcescens NOT DETECTED NOT DETECTED Final   Haemophilus influenzae NOT DETECTED NOT DETECTED Final   Neisseria meningitidis NOT DETECTED NOT DETECTED Final   Pseudomonas aeruginosa NOT DETECTED NOT DETECTED Final   Candida albicans NOT DETECTED NOT DETECTED Final   Candida glabrata NOT DETECTED NOT DETECTED Final   Candida krusei NOT DETECTED NOT DETECTED Final   Candida parapsilosis NOT DETECTED NOT DETECTED Final   Candida tropicalis NOT DETECTED NOT DETECTED Final    Comment: Performed at [facility], [address] [place], [place] [address]     Studies: No results found.  Scheduled Meds: . aspirin EC  81 mg Oral QPM  . cholecalciferol  1,000 Units Oral Daily  . [START ON 05/15/2016] digoxin  0.125 mg Oral Daily  . digoxin  0.25 mg Oral Daily  . fluticasone  1 spray Each Nare Daily  . furosemide  60 mg Oral Daily  . insulin aspart  0-9 Units Subcutaneous TID WC  . levalbuterol  0.63 mg Nebulization TID  . pantoprazole  40 mg Oral Daily  . potassium chloride  40 mEq Oral Daily  . sodium chloride flush  3 mL Intravenous Q12H  . tiotropium  18 mcg Inhalation Daily   Continuous Infusions: . amiodarone 60 mg/hr (05/14/16 0602)  . amiodarone      Active Problems:   COPD (chronic obstructive pulmonary disease) (HCC)   Nicotine dependence   Acute hepatitis   Acute on chronic systolic CHF (congestive heart failure) (HCC)   Acute renal failure superimposed on stage 3 chronic kidney disease (HCC)   Atypical chest pain   Essential hypertension   Hyperkalemia    Time spent: 25 minutes    Vassie LollMadera, Katlin Ciszewski  [facility] Pager [phone]. If 7PM-7AM, please contact night-coverage at [email or web address], password [email or web address] 05/14/2016, 8:43 AM  LOS: 3 days

## 2016-05-14 NOTE — Progress Notes (Signed)
eLink Physician-Brief Progress Note Patient Name: Howard RidgeJohn W Dixon DOB: April 17, 1945 MRN: [id number]   Date of Service  05/14/2016  HPI/Events of Note  Agitation on vent not controlled with intermittent sedation.  eICU Interventions  Placed on fentanyl gtt cont     Intervention Category Major Interventions: Other:  Howard Dixon 05/14/2016, 9:34 PM

## 2016-05-14 NOTE — Progress Notes (Signed)
Pharmacy - IV heparin  Assessment:    Please see note from Georgena SpurlingNik Glogovac, PharmD earlier today for full details.  Briefly, 71 y.o. male on IV heparin for new onset Afib in setting of AECHF   1630 HL subtherapeutic at 0.13 on 1100 units/hr.  Level was drawn > 1 hr early  Per RN, heparin was stopped ~45 min while central line inserted, but resumed and running ~1 hour before level drawn.  Plan:   Suspect true heparin level is higher than observed d/t above factors, but likely still on the low side. Will not be overly aggressive with questionable level.  Increase heparin IV to 1250 units/hr  Recheck heparin level in 8 hrs  Bernadene Personrew Karter Haire, PharmD, BCPS Pager: [phone] 05/14/2016, 3:41 PM

## 2016-05-14 NOTE — Progress Notes (Signed)
Notified patient's wife via telephone call that patient was transferred to room 1222.

## 2016-05-14 NOTE — Progress Notes (Signed)
Pt had 7 beats of V-tach which was followed by an increase in heart rate up to the 170's.  Monitor tech was concerned that patient may have went in to Afib so EKG was performed which showed Aflutter.  Heart rate continued in the 150's to 160's.  NP on call was notified and new order for  Amiodarone IV was received.  Will continue to monitor patient.

## 2016-05-14 NOTE — Progress Notes (Signed)
Pharmacy - IV heparin  Assessment:    Please see note from Georgena SpurlingNik Glogovac, PharmD earlier today for full details.  Briefly, 71 y.o. male on IV heparin for new onset Afib in setting of AECHF   1630 HL subtherapeutic at 0.13 on 1100 units/hr.  Level was drawn > 1 hr early  Per RN, heparin was stopped ~45 min while central line inserted, but resumed and running ~1 hour before level drawn.  2219 HL=0.32 at goal , no infusion or bleeding issues per RN  Plan:   Continue heparin drip at 1250 units/hr  Recheck heparin level with am labs   Lorenza EvangelistGreen, Carolynn Tuley R 05/14/2016, 11:19 PM

## 2016-05-15 ENCOUNTER — Inpatient Hospital Stay (HOSPITAL_COMMUNITY): Payer: Non-veteran care

## 2016-05-15 DIAGNOSIS — Z789 Other specified health status: Secondary | ICD-10-CM

## 2016-05-15 DIAGNOSIS — B179 Acute viral hepatitis, unspecified: Secondary | ICD-10-CM

## 2016-05-15 LAB — BLOOD GAS, ARTERIAL
Acid-base deficit: 6.1 mmol/L — ABNORMAL HIGH (ref 0.0–2.0)
Bicarbonate: 17.1 mmol/L — ABNORMAL LOW (ref 20.0–28.0)
DRAWN BY: 235321
FIO2: 60
MECHVT: 510 mL
O2 Saturation: 99.5 %
PATIENT TEMPERATURE: 98.6
PCO2 ART: 28.3 mmHg — AB (ref 32.0–48.0)
PEEP: 5 cmH2O
PO2 ART: 208 mmHg — AB (ref 83.0–108.0)
RATE: 14 resp/min
pH, Arterial: 7.397 (ref 7.350–7.450)

## 2016-05-15 LAB — PHOSPHORUS
PHOSPHORUS: 5.5 mg/dL — AB (ref 2.5–4.6)
Phosphorus: 5 mg/dL — ABNORMAL HIGH (ref 2.5–4.6)

## 2016-05-15 LAB — CULTURE, BLOOD (ROUTINE X 2)

## 2016-05-15 LAB — CBC
HCT: 38.6 % — ABNORMAL LOW (ref 39.0–52.0)
HEMOGLOBIN: 13 g/dL (ref 13.0–17.0)
MCH: 32.6 pg (ref 26.0–34.0)
MCHC: 33.7 g/dL (ref 30.0–36.0)
MCV: 96.7 fL (ref 78.0–100.0)
Platelets: 157 10*3/uL (ref 150–400)
RBC: 3.99 MIL/uL — AB (ref 4.22–5.81)
RDW: 15 % (ref 11.5–15.5)
WBC: 17.1 10*3/uL — ABNORMAL HIGH (ref 4.0–10.5)

## 2016-05-15 LAB — TROPONIN I: Troponin I: 0.48 ng/mL (ref <0.03)

## 2016-05-15 LAB — HEPARIN LEVEL (UNFRACTIONATED)
HEPARIN UNFRACTIONATED: 0.13 [IU]/mL — AB (ref 0.30–0.70)
Heparin Unfractionated: 0.22 IU/mL — ABNORMAL LOW (ref 0.30–0.70)
Heparin Unfractionated: 0.37 IU/mL (ref 0.30–0.70)

## 2016-05-15 LAB — GLUCOSE, CAPILLARY
GLUCOSE-CAPILLARY: 237 mg/dL — AB (ref 65–99)
GLUCOSE-CAPILLARY: 274 mg/dL — AB (ref 65–99)
Glucose-Capillary: 235 mg/dL — ABNORMAL HIGH (ref 65–99)
Glucose-Capillary: 186 mg/dL — ABNORMAL HIGH (ref 65–99)
Glucose-Capillary: 238 mg/dL — ABNORMAL HIGH (ref 65–99)
Glucose-Capillary: 247 mg/dL — ABNORMAL HIGH (ref 65–99)

## 2016-05-15 LAB — MAGNESIUM
MAGNESIUM: 1.9 mg/dL (ref 1.7–2.4)
Magnesium: 1.9 mg/dL (ref 1.7–2.4)

## 2016-05-15 MED ORDER — ACETAMINOPHEN 650 MG RE SUPP
650.0000 mg | RECTAL | Status: DC | PRN
  Administered 2016-05-15 (×3): 650 mg via RECTAL
  Filled 2016-05-15 (×3): qty 1

## 2016-05-15 MED ORDER — ACETAMINOPHEN 160 MG/5ML PO SOLN
650.0000 mg | Freq: Four times a day (QID) | ORAL | Status: DC | PRN
  Administered 2016-05-16: 650 mg
  Filled 2016-05-15: qty 20.3

## 2016-05-15 MED ORDER — CHLORHEXIDINE GLUCONATE CLOTH 2 % EX PADS
6.0000 | MEDICATED_PAD | Freq: Every day | CUTANEOUS | Status: DC
  Administered 2016-05-15 – 2016-05-17 (×3): 6 via TOPICAL

## 2016-05-15 MED ORDER — SODIUM CHLORIDE 0.9 % IV SOLN
INTRAVENOUS | Status: DC
  Filled 2016-05-15: qty 2.5

## 2016-05-15 MED ORDER — INSULIN ASPART 100 UNIT/ML ~~LOC~~ SOLN
0.0000 [IU] | SUBCUTANEOUS | Status: DC
  Administered 2016-05-15: 7 [IU] via SUBCUTANEOUS
  Administered 2016-05-15: 4 [IU] via SUBCUTANEOUS
  Administered 2016-05-15 (×2): 7 [IU] via SUBCUTANEOUS
  Administered 2016-05-16 (×2): 11 [IU] via SUBCUTANEOUS
  Administered 2016-05-16 (×4): 15 [IU] via SUBCUTANEOUS

## 2016-05-15 MED ORDER — PANTOPRAZOLE SODIUM 40 MG PO PACK
40.0000 mg | PACK | Freq: Every day | ORAL | Status: DC
  Administered 2016-05-15 – 2016-05-17 (×2): 40 mg
  Filled 2016-05-15 (×3): qty 20

## 2016-05-15 MED ORDER — VANCOMYCIN HCL IN DEXTROSE 750-5 MG/150ML-% IV SOLN
750.0000 mg | INTRAVENOUS | Status: DC
  Filled 2016-05-15: qty 150

## 2016-05-15 MED ORDER — NOREPINEPHRINE BITARTRATE 1 MG/ML IV SOLN
0.0000 ug/min | INTRAVENOUS | Status: DC
  Administered 2016-05-15: 18 ug/min via INTRAVENOUS
  Filled 2016-05-15 (×4): qty 16

## 2016-05-15 MED ORDER — VANCOMYCIN HCL 10 G IV SOLR
1250.0000 mg | Freq: Once | INTRAVENOUS | Status: AC
  Administered 2016-05-15: 1250 mg via INTRAVENOUS
  Filled 2016-05-15: qty 1250

## 2016-05-15 MED ORDER — PIPERACILLIN-TAZOBACTAM 3.375 G IVPB
3.3750 g | Freq: Three times a day (TID) | INTRAVENOUS | Status: DC
  Administered 2016-05-15 – 2016-05-16 (×3): 3.375 g via INTRAVENOUS
  Filled 2016-05-15 (×3): qty 50

## 2016-05-15 MED ORDER — HEPARIN (PORCINE) IN NACL 100-0.45 UNIT/ML-% IJ SOLN
1500.0000 [IU]/h | INTRAMUSCULAR | Status: DC
  Administered 2016-05-15 – 2016-05-16 (×2): 1500 [IU]/h via INTRAVENOUS

## 2016-05-15 NOTE — Assessment & Plan Note (Addendum)
05/15/2016 - on 40% fio2 with good vent synchrony but does not meet SBT criteria due to septic shock. ET tube 4cm above carina  Plan  = advance et tube 1 cm - full vent support - no extubation - BD

## 2016-05-15 NOTE — Progress Notes (Signed)
  ANTICOAGULATION CONSULT NOTE - Follow Up Consult  Pharmacy Consult for IV heparin Indication: atrial fibrillation  See pharmacist note from earlier today for further detail.  Following up on heparin level ordered for this evening.   Heparin level therapeutic at 0.37 with infusion at 1500 units/hr Early this morning, patient had some oozing blood from central line site and penis early. Tonight, RN reports central line site clear of blood and dressing on penis without blood as well.  Goal of Therapy:  Heparin level 0.3-0.7 units/ml Monitor platelets by anticoagulation protocol: Yes   Plan:  Continue heparin infusion at 1500 units/hr. Check heparin level with AM labs. Daily CBC.  Clance BollAmanda Lashica Hannay, PharmD, BCPS Pager: [phone] 05/15/2016 7:45 PM

## 2016-05-15 NOTE — Progress Notes (Signed)
Patients oral temperature registering 104.7 - notified Amy, RN, who is patients primary nurse.

## 2016-05-15 NOTE — Consult Note (Signed)
Ref: Tillman Abide, MD   Subjective:  Remains intubated and sedated.   Objective:  Vital Signs in the last 24 hours: Temp:  [92.3 F (33.5 C)-98.4 F (36.9 C)] 97.9 F (36.6 C) (02/24 0338) Pulse Rate:  [30-114] 114 (02/24 0930) Cardiac Rhythm: Normal sinus rhythm (02/24 0800) Resp:  [15-29] 17 (02/24 0930) BP: (73-117)/(12-79) 113/68 (02/24 0930) SpO2:  [30 %-100 %] 100 % (02/24 0930) FiO2 (%):  [40 %-100 %] 40 % (02/24 0800) Weight:  [76.7 kg (169 lb 1.5 oz)] 76.7 kg (169 lb 1.5 oz) (02/24 0420)  Physical Exam: BP Readings from Last 1 Encounters:  05/15/16 113/68    Wt Readings from Last 1 Encounters:  05/15/16 76.7 kg (169 lb 1.5 oz)    Weight change: 1.131 kg (2 lb 7.9 oz) Body mass index is 27.29 kg/m. HEENT: [place]/AT, Eyes- Conjunctiva-Pink, Sclera-Non-icteric Intubated. Neck: No JVD, No bruit, Trachea midline. Lungs:  Clearing, Bilateral. Cardiac:  Mildly tachycardic and regular rhythm, normal S1 and S2, no S3. II/VI systolic murmur. Abdomen:  Soft, BS present. Extremities:  Trace edema present. No cyanosis. No clubbing. CNS: AxOx0, Cranial nerves grossly intact.  Skin: Warm and dry.   Intake/Output from previous day: 02/23 0701 - 02/24 0700 In: 2231.1 [I.V.:1711.1; NG/GT:520] Out: 905 [Urine:905]    Lab Results: BMET    Component Value Date/Time   NA 121 (L) 05/15/2016 0838   NA 128 (L) 05/14/2016 0455   NA 131 (L) 05/13/2016 0529   K 5.3 (H) 05/15/2016 0838   K 4.2 05/14/2016 0455   K 3.3 (L) 05/13/2016 0529   CL 87 (L) 05/15/2016 0838   CL 92 (L) 05/14/2016 0455   CL 94 (L) 05/13/2016 0529   CO2 21 (L) 05/15/2016 0838   CO2 22 05/14/2016 0455   CO2 25 05/13/2016 0529   GLUCOSE 239 (H) 05/15/2016 0838   GLUCOSE 193 (H) 05/14/2016 0455   GLUCOSE 155 (H) 05/13/2016 0529   BUN 59 (H) 05/15/2016 0838   BUN 43 (H) 05/14/2016 0455   BUN 51 (H) 05/13/2016 0529   CREATININE 2.69 (H) 05/15/2016 0838   CREATININE 1.69 (H) 05/14/2016 0455   CREATININE  1.57 (H) 05/13/2016 0529   CALCIUM 8.5 (L) 05/15/2016 0838   CALCIUM 9.4 05/14/2016 0455   CALCIUM 9.2 05/13/2016 0529   GFRNONAA 23 (L) 05/15/2016 0838   GFRNONAA 40 (L) 05/14/2016 0455   GFRNONAA 43 (L) 05/13/2016 0529   GFRAA 26 (L) 05/15/2016 0838   GFRAA 46 (L) 05/14/2016 0455   GFRAA 50 (L) 05/13/2016 0529   CBC    Component Value Date/Time   WBC 17.1 (H) 05/15/2016 0222   RBC 3.99 (L) 05/15/2016 0222   HGB 13.0 05/15/2016 0222   HCT 38.6 (L) 05/15/2016 0222   PLT 157 05/15/2016 0222   MCV 96.7 05/15/2016 0222   MCH 32.6 05/15/2016 0222   MCHC 33.7 05/15/2016 0222   RDW 15.0 05/15/2016 0222   LYMPHSABS 1.9 2016-05-19 0045   MONOABS 0.8 2016/05/19 0045   EOSABS 0.0 2016-05-19 0045   BASOSABS 0.0 19-May-2016 0045   HEPATIC Function Panel  Recent Labs  03/18/16 0448 2016/05/19 0045 05/14/16 0455  PROT 7.9 7.8 7.2   HEMOGLOBIN A1C No components found for: HGA1C,  MPG CARDIAC ENZYMES Lab Results  Component Value Date   CKTOTAL 612 (H) 2016-05-19   TROPONINI 0.48 (HH) 05/15/2016   TROPONINI 0.29 (HH) 05/14/2016   TROPONINI 0.17 (HH) 05/14/2016   BNP No results for input(s): PROBNP in  the last 8760 hours. TSH No results for input(s): TSH in the last 8760 hours. CHOLESTEROL  Recent Labs  03/10/16 1236  CHOL 144    Scheduled Meds: . aspirin  81 mg Per Tube QPM  . Chlorhexidine Gluconate Cloth  6 each Topical Q0600  . cholecalciferol  1,000 Units Oral Daily  . digoxin  0.25 mg Intravenous Once  . digoxin  0.125 mg Oral Daily  . feeding supplement (PRO-STAT SUGAR FREE 64)  30 mL Per Tube BID  . feeding supplement (VITAL HIGH PROTEIN)  1,000 mL Per Tube Q24H  . fluticasone  1 spray Each Nare Daily  . furosemide  60 mg Oral Daily  . insulin aspart  0-20 Units Subcutaneous Q4H  . ipratropium  0.5 mg Nebulization QID  . levalbuterol  0.63 mg Nebulization TID  . pantoprazole sodium  40 mg Per Tube Daily  . sodium chloride flush  3 mL Intravenous Q12H    Continuous Infusions: . amiodarone 30 mg/hr (05/15/16 0330)  . fentaNYL infusion INTRAVENOUS 75 mcg/hr (05/15/16 0500)  . heparin 1,500 Units/hr (05/15/16 0422)  . norepinephrine (LEVOPHED) Adult infusion 16 mcg/min (05/15/16 0900)   PRN Meds:.sodium chloride, docusate, fentaNYL, fentaNYL (SUBLIMAZE) injection, fentaNYL (SUBLIMAZE) injection, levalbuterol, midazolam, midazolam, ondansetron (ZOFRAN) IV, sodium chloride flush  Assessment/Plan: Cardiogenic shock Atrial flutter with 2: 1 block, now sinus tachycardia Acute respiratory failure COPD CAD Aortic stenosis DM, II Tobacco use disorder CKD, III  Plan Continue medical treatment.   LOS: 4 days    Orpah CobbAjay Apollo Timothy  MD  05/15/2016, 10:04 AM

## 2016-05-15 NOTE — Assessment & Plan Note (Signed)
05/15/2016 - in sinus with amio gtt and heparin gtt ongoing and on po gigoxin and   Plan  - per cardiology - dc lopressor given shiock

## 2016-05-15 NOTE — Progress Notes (Signed)
Pharmacy - IV heparin  Assessment:    Please see note from Georgena SpurlingNik Glogovac, PharmD 2/23 for full details.  Briefly, 71 y.o. male on IV heparin for new onset Afib in setting of AECHF  0222 HL=0.13, was therapeutic at 2200 on 2/23 now sub-therapeutic, no infusion or bleeding issues per RN  Plan:   Increase heparin drip t 1500 units/hr  Recheck heparin level in 6 hours   Lorenza EvangelistGreen, Garland Smouse R 05/15/2016, 4:22 AM

## 2016-05-15 NOTE — Assessment & Plan Note (Signed)
05/15/2016 - shock liver. Improved as of yesterday  Plan - recheck LFT

## 2016-05-15 NOTE — Assessment & Plan Note (Signed)
contnue TF and PPI

## 2016-05-15 NOTE — Assessment & Plan Note (Signed)
05/15/2016 - rn reports hyperglycemia  Plan  - start phase 2 insulin gprotocol

## 2016-05-15 NOTE — Progress Notes (Signed)
RN requested to have Norepinephrine infusion changed to quadruple strength secondary to high rate, CHF. Discussed with RN, Lorrin JacksonAmy Arnold, to adjust settings on Alaris pump to reflect new concentration of infusion. RN verbalized understanding.   Greer PickerelJigna Len Kluver, PharmD, BCPS Pager: [phone] 05/15/2016 7:53 AM

## 2016-05-15 NOTE — Progress Notes (Signed)
Pharmacy Antibiotic Note  Howard Dixon is a 71 y.o. male admitted on 2016/10/07 with sepsis.  Pharmacy has been consulted for vancomycin/Zosyn dosing.  Pt with acute respiratory failure with hypoxia with spiking fevers.   Plan:  Zosyn 3.375g IV q8h (4 hour infusion).   Vancomycin 1250 mg IV x1, then vancomycin 750 mg IV q24h  Pt renal function has decreased over last few days. Will monitor renal function closely.  Monitor clinical course, renal function, cultures as available   Height: 5\' 6"  (167.6 cm) Weight: 169 lb 1.5 oz (76.7 kg) IBW/kg (Calculated) : 63.8  Temp (24hrs), Avg:97.3 F (36.3 C), Min:92.3 F (33.5 C), Max:100.8 F (38.2 C)   Recent Labs Lab 2017/02/09 0045 05/12/16 0528 05/13/16 0529 05/14/16 0455 05/14/16 1007 05/15/16 0222 05/15/16 0838  WBC 12.3*  --   --   --  10.9* 17.1*  --   CREATININE 2.31* 2.08* 1.57* 1.69*  --   --  2.69*    Estimated Creatinine Clearance: 25.3 mL/min (by C-G formula based on SCr of 2.69 mg/dL (H)).    Allergies  Allergen Reactions  . Sulfasalazine Rash    Antimicrobials this admission: 2/24 Zosyn >>  2/24 vancomycin >>   Dose adjustments this admission:   Microbiology results: 2/20 BCx: CoNS, diptheroids 2/23 MRSA PCR: negative 2/24: urine 2/24: BCx:  Thank you for allowing pharmacy to be a part of this patient's care.   Howard ColeNikola Kiesha Dixon, PharmD, BCPS Pager [phone] 05/15/2016 1:40 PM

## 2016-05-15 NOTE — Progress Notes (Signed)
PULMONARY / CRITICAL CARE MEDICINE   Name: Howard Dixon MRN: [id number] DOB: March 02, 1946    ADMISSION DATE:  04/22/2016 CONSULTATION DATE:  05/14/16  REFERRING MD:  Dr. Gwenlyn Perking   CHIEF COMPLAINT:  Respiratory distress, hypotension   brief 71 y/o M who presented 2/20 to [facility] via EMS with complaints of lower epigastric pain, chest tightness, SOB, 1 episode of vomiting & constipation.   On admit, the patient reported he had attempted miralax without relief.  He also had noted increased lower extremity swelling and 5-6 lb weight gain.  He has a known hx of CAD, COPD, DM, former ETOH abuse (30 years ago), HTN, CHF (LVEF 25-30%), ongoing tobacco abuse and aortic aneurysm.  He had a low dose CT screen for lung cancer and the aneurysm was incidentally found > he is followed at [facility] for the same and has been told he would be a poor surgical candidate.  Initial work up included a negative EKG, troponin of 0.08, hyponatremia, elevated transaminases (AST 2,242 / ALT 2,232)  and AKI.  CT of the abdomen was evaluated and demonstrated an enlarged L lobe of liver concerning for early cirrhosis, gallbladder sludge vs small stones, 7 mm non-obstructing R renal stone without hydronephrosis, small ascites, sigmoid diverticulosis, no bowel obstruction & small R effusion.  He was admitted by [facility] for concerns of hepatorenal syndrome.  The patient was diuresed with improvement in renal function (4.2L negative balance for admit).  Hepatitis panel was evaluated and negative.  Overnight 2/23 he developed atrial flutter and was placed on amiodarone.  Cardiology was consulted.  Afternoon of 2/23, the patient became agitated and restless.  He also developed hypotension.  ABG assessed > 7.463 / 20 / 531 / 14.2.  The patient was treated with low dose beta blocker (lopressor 2.5mg ) for rate.  Unfortunately, the rate was difficult to control.  He acutely decompensated with near respiratory arrest.    PCCM consulted for emergent  intubation / evaluation.   STUDIES:  2/20  CT ABD/Pelvis >> enlarged L lobe of liver concerning for early cirrhosis, gallbladder sludge vs small stones, 7 mm non-obstructing R renal stone without hydronephrosis, small ascites, sigmoid diverticulosis, no bowel obstruction, small R effusion 2/20 LE Duplex >> negative for DVT 2/21 ECHO >> LV mildly dilated, LVEF 20%, diffuse hypokinesis, no LV thrombus, grade II diastolic dysfunction, moderate AS, PA Peak 56   CULTURES: BCx2 2/20 >> 1/2 with coag neg staph  ANTIBIOTICS:   LINES/TUBES: ETT 2/23 >> R IJ TLC 2/23 >>  SIGNIFICANT EVENTS: 2/20  Admit with sCHF  2/23  PCCM consulted for hypotension, AMS, near resp arrest   SUBJECTIVE:  2/24 - intubated. On levophed gtt, amio gtt, heparing tt, fent gtt. RASS -2. Able to track. RN reports high sugars  VITAL SIGNS: BP 114/74   Pulse (!) 109   Temp 97.9 F (36.6 C) (Axillary)   Resp 16   Ht 5\' 6"  (1.676 m)   Wt 76.7 kg (169 lb 1.5 oz)   SpO2 91%   BMI 27.29 kg/m   HEMODYNAMICS:    VENTILATOR SETTINGS: Vent Mode: PRVC FiO2 (%):  [40 %-100 %] 40 % Set Rate:  [14 bmp-16 bmp] 14 bmp Vt Set:  [510 mL] 510 mL PEEP:  [5 cmH20] 5 cmH20 Plateau Pressure:  [16 cmH20-18 cmH20] 18 cmH20  INTAKE / OUTPUT: I/O last 3 completed shifts: In: 2231.1 [I.V.:1711.1; NG/GT:520] Out: 1955 [Urine:1955]  PHYSICAL EXAMINATION:  General Appearance:    Looks criticall ill  OBESE - no  Head:    Normocephalic, without obvious abnormality, atraumatic  Eyes:    PERRL - yes, conjunctiva/corneas - clear      Ears:    Normal external ear canals, both ears  Nose:   NG tube - no  Throat:  ETT TUBE - yes , OG tube - yes with TF +  Neck:   Supple,  No enlargement/tenderness/nodules     Lungs:     Clear to auscultation bilaterally, Ventilator   Synchrony - yes  Chest wall:    No deformity  Heart:    S1 and S2 normal, no murmur, CVP - na.  Pressors - levophed +  Abdomen:     Soft, no masses, no  organomegaly  Genitalia:    Not done  Rectal:   not done  Extremities:   Extremities- no edema     Skin:   Intact in exposed areas . Sacral area - none reported by RN     Neurologic:   Sedation - fent gtt -> RASS - -2 . Moves all 4s - yes. CAM-ICU - unable to assess . Orientation - tracks +      dat of adnit 05/15/2016  LABS: PULMONARY  Recent Labs Lab 05/14/16 1212 05/14/16 1425 05/15/16 0326  PHART 7.463* 7.267* 7.397  PCO2ART 20.1* 27.0* 28.3*  PO2ART 531* 460* 208*  HCO3 14.2* 11.9* 17.1*  O2SAT ABOVE REPORTABLE RANGE 99.7 99.5    CBC  Recent Labs Lab 15-May-2016 0045 05/14/16 1007 05/15/16 0222  HGB 12.7* 13.3 13.0  HCT 36.5* 38.5* 38.6*  WBC 12.3* 10.9* 17.1*  PLT 144* 123* 157    COAGULATION  Recent Labs Lab 2016-05-15 0650 05/14/16 1007  INR 1.79 1.53    CARDIAC   Recent Labs Lab 05-15-2016 2316 05/14/16 1204 05/14/16 1516 05/14/16 2219 05/15/16 0222  TROPONINI 0.16* 0.12* 0.17* 0.29* 0.48*   No results for input(s): PROBNP in the last 168 hours.   CHEMISTRY  Recent Labs Lab 05-15-2016 0045 May 15, 2016 1255 05/12/16 0528 05/13/16 0529 05/14/16 0455 05/14/16 1516  NA 126*  --  130* 131* 128*  --   K 5.7*  --  3.9 3.3* 4.2  --   CL 92*  --  94* 94* 92*  --   CO2 19*  --  25 25 22   --   GLUCOSE 63*  --  150* 155* 193*  --   BUN 72*  --  67* 51* 43*  --   CREATININE 2.31*  --  2.08* 1.57* 1.69*  --   CALCIUM 10.5*  --  9.3 9.2 9.4  --   MG  --  2.5*  --   --   --  1.9  PHOS  --   --   --   --   --  6.5*   Estimated Creatinine Clearance: 40.3 mL/min (by C-G formula based on SCr of 1.69 mg/dL (H)).   LIVER  Recent Labs Lab 05/15/2016 0045 05-15-2016 0650 05/14/16 0455 05/14/16 1007  AST 2,242*  --  432*  --   ALT 2,232*  --  1,060*  --   ALKPHOS 74  --  66  --   BILITOT 2.6*  --  3.9*  --   PROT 7.8  --  7.2  --   ALBUMIN 4.1  --  3.8  --   INR  --  1.79  --  1.53     INFECTIOUS No results for input(s): LATICACIDVEN,  PROCALCITON in the  last 168 hours.   ENDOCRINE CBG (last 3)   Recent Labs  05/14/16 1945 05/14/16 2315 05/15/16 0350  GLUCAP 192* 214* 237*         IMAGING x48h  - image(s) personally visualized  -   highlighted in bold Dg Abd 1 View  Result Date: 05/14/2016 CLINICAL DATA:  Intubation EXAM: ABDOMEN - 1 VIEW COMPARISON:  Abdominal CT from 3 days ago FINDINGS: Nasogastric tube tip overlaps the proximal stomach; the side port is at the GE junction. Normal bowel gas pattern. IMPRESSION: Nasogastric tube tip projects over the proximal stomach. The side port is over the GE junction. Electronically Signed   By: Marnee SpringJonathon  Watts M.D.   On: 05/14/2016 14:37   Dg Chest Port 1 View  Result Date: 05/15/2016 CLINICAL DATA:  Initial evaluation for acute respiratory failure, hypoxia, COPD. EXAM: PORTABLE CHEST 1 VIEW COMPARISON:  Prior radiograph from 05/14/2016. FINDINGS: Patient is intubated with the tip of an endotracheal tube positioned approximately 4 cm above the carina. Enteric tube courses in the the abdomen. Right IJ approach central venous catheter remains in place with tip overlying the distal SVC. Cardiomegaly stable. Mediastinal silhouette within normal limits. Aortic atherosclerosis noted. Lungs mildly hypoinflated. There has been mild interval worsening and pulmonary interstitial edema and congestion as compared to previous. Trace left pleural effusion is present. Mild scattered atelectatic changes at the left lung base. No new focal infiltrates. No pneumothorax. Osseous structures unchanged. IMPRESSION: 1. Tip of the endotracheal tube 4 cm above the carina. Remaining support apparatus as above. 2. Mild interval worsening and pulmonary interstitial edema as compared to 2/20 3/18. 3. Trace left pleural effusion. Electronically Signed   By: Rise MuBenjamin  McClintock M.D.   On: 05/15/2016 03:07   Dg Chest Port 1 View  Result Date: 05/14/2016 CLINICAL DATA:  Intubation EXAM: PORTABLE CHEST 1 VIEW  COMPARISON:  05/14/2016 FINDINGS: Endotracheal tube in the left main bronchus. Recommend withdrawal 5 cm for optimum position of the trachea. Right jugular central venous catheter tip in the SVC. No pneumothorax NG tube in the stomach Mild atelectasis in the lung bases. IMPRESSION: Endotracheal tube in the left main bronchus, recommend withdrawal of 5 cm Central venous catheter tip in the SVC.  No pneumothorax Mild bibasilar atelectasis. These results were called by telephone at the time of interpretation on 05/14/2016 at 2:41 pm to [facility], [place] , who verbally acknowledged these results. Electronically Signed   By: Marlan Palauharles  Clark M.D.   On: 05/14/2016 14:41   Dg Chest Port 1 View  Result Date: 05/14/2016 CLINICAL DATA:  Shortness of breath.  Cough.  COPD. EXAM: PORTABLE CHEST 1 VIEW COMPARISON:  05/02/2016 FINDINGS: The cardiac silhouette is mildly enlarged. Aortic atherosclerosis is again noted. There is chronic peribronchial thickening which has slightly decreased. There is minimal asymmetric opacity in the left lung base which does not appear increased from the prior study. There is blunting of the left lateral costophrenic sulcus which may reflect a new small pleural effusion. Subcentimeter nodular density in the right lung base is unchanged. No pneumothorax. Mild thoracic spondylosis. Old left clavicle fracture. IMPRESSION: 1. Persistent mild left basilar opacity which may reflect atelectasis although early infection is not excluded. 2. Possible small left pleural effusion. 3. COPD. 4. Aortic atherosclerosis. Electronically Signed   By: Sebastian AcheAllen  Grady M.D.   On: 05/14/2016 12:16      DISCUSSION: 71 y/o M admitted with abdominal pain, SOB. Work up concerning for decompensation of sCHF, possible hepatorenal syndrome.  Developed  atrial flutter and later acutely decompensated 2/23, intubated.        ASSESSMENT / PLAN:  Acute respiratory failure with hypoxia (HCC) with underlying COPD and tobacco  use 05/15/2016 - on 40% fio2 with good vent synchrony but does not meet SBT criteria due to septic shock. ET tube 4cm above carina  Plan  = advance et tube 1 cm - full vent support - no extubation - BD  Acute hepatitis 05/15/2016 - shock liver. Improved as of yesterday  Plan - recheck LFT  Acute on chronic systolic CHF (congestive heart failure) (HCC) 05/15/2016 - circulatory shock.Trop profle only c/w demand ischemia  PLAN  - MAP goal > 65 - Migh needs SCVO2 monitoring  Atrial fibrillation, new onset (HCC) 05/15/2016 - in sinus with amio gtt and heparin gtt ongoing and on po gigoxin and   Plan  - per cardiology - dc lopressor given shiock  GERD (gastroesophageal reflux disease) contnue TF and PPI  Type 2 diabetes, controlled, with neuropathy (HCC) 05/15/2016 - rn reports hyperglycemia  Plan  - start phase 2 insulin gprotocol  Electrolyte imbalance - low sodium yesterday and mild low mag yesterday. Today's lab pending  Plan  - await 05/15/2016 labs      Critical Care Time devoted to patient care services described in this note is  30  Minutes. This time reflects time of care of this signee Dr Kalman Shan. This critical care time does not reflect procedure time, or teaching time or supervisory time of PA/NP/Med student/Med Resident etc but could involve care discussion time    Dr. Kalman Shan, M.D., [facility].C.P Pulmonary and Critical Care Medicine Staff Physician [facility] Pulmonary and Critical Care Pager: [phone], If no answer or between  15:00h - 7:00h: call [phone]  05/15/2016 7:38 AM

## 2016-05-15 NOTE — Assessment & Plan Note (Signed)
-   low sodium yesterday and mild low mag yesterday. Today's lab pending  Plan  - await 05/15/2016 labs

## 2016-05-15 NOTE — Progress Notes (Signed)
Advanced ET tube 2 cm, per order.  Now 23 cm @ lips.

## 2016-05-15 NOTE — Progress Notes (Signed)
eLink Physician-Brief Progress Note Patient Name: Howard RidgeJohn W Dixon DOB: 1945-07-11 MRN: [id number]   Date of Service  05/15/2016  HPI/Events of Note  Mg++ = 1.9 (normal) and Creatinine = 2.69.  eICU Interventions  Will not replace Mg++ at this time.      Intervention Category Major Interventions: Electrolyte abnormality - evaluation and management  Lenell AntuSommer,Steven Eugene 05/15/2016, 6:33 PM

## 2016-05-15 NOTE — Assessment & Plan Note (Addendum)
05/15/2016 - circulatory shock.Trop profle only c/w demand ischemia  PLAN  - MAP goal > 65 - Migh needs SCVO2 monitoring

## 2016-05-15 NOTE — Progress Notes (Signed)
ANTICOAGULATION CONSULT NOTE - Initial Consult  Pharmacy Consult for IV heparin Indication: atrial fibrillation  Allergies  Allergen Reactions  . Sulfasalazine Rash    Patient Measurements: Height: 5\' 6"  (167.6 cm) Weight: 169 lb 1.5 oz (76.7 kg) IBW/kg (Calculated) : 63.8 Heparin Dosing Weight:   Vital Signs: Temp: 100.8 F (38.2 C) (02/24 0800) Temp Source: Oral (02/24 0800) BP: 102/65 (02/24 1300) Pulse Rate: 121 (02/24 1300)  Labs:  Recent Labs  05/13/16 0529 05/14/16 0455 05/14/16 1007  05/14/16 1516 05/14/16 2219 05/15/16 0222 05/15/16 0838 05/15/16 1113  HGB  --   --  13.3  --   --   --  13.0  --   --   HCT  --   --  38.5*  --   --   --  38.6*  --   --   PLT  --   --  123*  --   --   --  157  --   --   LABPROT  --   --  18.5*  --   --   --   --   --   --   INR  --   --  1.53  --   --   --   --   --   --   HEPARINUNFRC  --   --   --   < > 0.13* 0.32 0.13*  --  0.22*  CREATININE 1.57* 1.69*  --   --   --   --   --  2.69*  --   TROPONINI  --   --   --   < > 0.17* 0.29* 0.48*  --   --   < > = values in this interval not displayed.  Estimated Creatinine Clearance: 25.3 mL/min (by C-G formula based on SCr of 2.69 mg/dL (H)).   Medical History: Past Medical History:  Diagnosis Date  . Allergic rhinitis due to pollen   . COPD (chronic obstructive pulmonary disease) (HCC)   . Coronary artery disease, occlusive   . GERD (gastroesophageal reflux disease)   . Hyperlipidemia   . Hypertension   . Type II or unspecified type diabetes mellitus with neurological manifestations, not stated as uncontrolled(250.60)     Medications:  Scheduled:  . aspirin  81 mg Per Tube QPM  . Chlorhexidine Gluconate Cloth  6 each Topical Q0600  . cholecalciferol  1,000 Units Oral Daily  . digoxin  0.25 mg Intravenous Once  . digoxin  0.125 mg Oral Daily  . feeding supplement (PRO-STAT SUGAR FREE 64)  30 mL Per Tube BID  . feeding supplement (VITAL HIGH PROTEIN)  1,000 mL  Per Tube Q24H  . fluticasone  1 spray Each Nare Daily  . furosemide  60 mg Oral Daily  . insulin aspart  0-20 Units Subcutaneous Q4H  . ipratropium  0.5 mg Nebulization QID  . levalbuterol  0.63 mg Nebulization TID  . pantoprazole sodium  40 mg Per Tube Daily  . piperacillin-tazobactam (ZOSYN)  IV  3.375 g Intravenous Q8H  . sodium chloride flush  3 mL Intravenous Q12H  . vancomycin  1,250 mg Intravenous Once  . [START ON 05/16/2016] vancomycin  750 mg Intravenous Q24H    Assessment: Pharmacy is consulted to dose heparin drip in 71 yo male newly diagnosed with atrial flutter. Pt also started on digoxin and amiodarone. troponins also elevated.   Today, 05/15/16  Hgb 13.0, plt 157  SCr 2.69 worsening , CrCl 25 ml/min  HL is 0.22  Spoke to RN, who stated that there was some oozing blood from pt central line site and penis. Paged and spoke to Dr. Algie Coffer regarding this issue. Informed MD that level was a bit low at 0.22 but due to the bleeding issues, MD stated to continue current rate of 1500 units/hr and draw another level tonight and reevaluate HL.    Goal of Therapy:  Heparin level 0.3-0.7 units/ml Monitor platelets by anticoagulation protocol: Yes   Plan:   Per conversation with MD as above, continue heparin 1500 units/hr.  Monitor for signs and symptoms of bleeding  Heparin level tonight per conversation with MD   Adalberto Cole, PharmD, BCPS Pager [phone] 05/15/2016 1:27 PM

## 2016-05-15 NOTE — Progress Notes (Signed)
eLink Physician-Brief Progress Note Patient Name: Ebbie RidgeJohn W Borchard DOB: September 23, 1945 MRN: [id number]   Date of Service  05/15/2016  HPI/Events of Note  Request to change route of Tylenol from rectal to per tube.   eICU Interventions  Will order:  1. D/C Tylenol Suppositories.  2. Tylenol Suspension 650 mg per tube Q 6 hours PRN Temp > 100.5 F.     Intervention Category Minor Interventions: Routine modifications to care plan (e.g. PRN medications for pain, fever)  Kadin Canipe Eugene 05/15/2016, 10:33 PM

## 2016-05-16 ENCOUNTER — Inpatient Hospital Stay (HOSPITAL_COMMUNITY): Payer: Non-veteran care

## 2016-05-16 DIAGNOSIS — R579 Shock, unspecified: Secondary | ICD-10-CM

## 2016-05-16 DIAGNOSIS — E878 Other disorders of electrolyte and fluid balance, not elsewhere classified: Secondary | ICD-10-CM

## 2016-05-16 DIAGNOSIS — R6520 Severe sepsis without septic shock: Secondary | ICD-10-CM

## 2016-05-16 DIAGNOSIS — A419 Sepsis, unspecified organism: Secondary | ICD-10-CM

## 2016-05-16 LAB — BASIC METABOLIC PANEL
ANION GAP: 16 — AB (ref 5–15)
ANION GAP: 16 — AB (ref 5–15)
Anion gap: 13 (ref 5–15)
Anion gap: 15 (ref 5–15)
BUN: 59 mg/dL — AB (ref 6–20)
BUN: 83 mg/dL — ABNORMAL HIGH (ref 6–20)
BUN: 80 mg/dL — AB (ref 6–20)
BUN: 86 mg/dL — AB (ref 6–20)
CALCIUM: 8.5 mg/dL — AB (ref 8.9–10.3)
CHLORIDE: 87 mmol/L — AB (ref 101–111)
CHLORIDE: 88 mmol/L — AB (ref 101–111)
CO2: 21 mmol/L — AB (ref 22–32)
CO2: 20 mmol/L — AB (ref 22–32)
CO2: 20 mmol/L — AB (ref 22–32)
CO2: 20 mmol/L — AB (ref 22–32)
CREATININE: 2.69 mg/dL — AB (ref 0.61–1.24)
Calcium: 8.1 mg/dL — ABNORMAL LOW (ref 8.9–10.3)
Calcium: 7.8 mg/dL — ABNORMAL LOW (ref 8.9–10.3)
Calcium: 7.5 mg/dL — ABNORMAL LOW (ref 8.9–10.3)
Chloride: 87 mmol/L — ABNORMAL LOW (ref 101–111)
Chloride: 86 mmol/L — ABNORMAL LOW (ref 101–111)
Creatinine, Ser: 4.14 mg/dL — ABNORMAL HIGH (ref 0.61–1.24)
Creatinine, Ser: 4.46 mg/dL — ABNORMAL HIGH (ref 0.61–1.24)
Creatinine, Ser: 4.61 mg/dL — ABNORMAL HIGH (ref 0.61–1.24)
GFR calc Af Amer: 14 mL/min — ABNORMAL LOW (ref >60)
GFR calc Af Amer: 14 mL/min — ABNORMAL LOW (ref >60)
GFR calc non Af Amer: 23 mL/min — ABNORMAL LOW (ref >60)
GFR calc non Af Amer: 13 mL/min — ABNORMAL LOW (ref >60)
GFR calc non Af Amer: 12 mL/min — ABNORMAL LOW (ref >60)
GFR calc non Af Amer: 12 mL/min — ABNORMAL LOW (ref >60)
GFR, EST AFRICAN AMERICAN: 26 mL/min — AB (ref >60)
GFR, EST AFRICAN AMERICAN: 16 mL/min — AB (ref >60)
GLUCOSE: 281 mg/dL — AB (ref 65–99)
GLUCOSE: 323 mg/dL — AB (ref 65–99)
GLUCOSE: 327 mg/dL — AB (ref 65–99)
Glucose, Bld: 239 mg/dL — ABNORMAL HIGH (ref 65–99)
POTASSIUM: 5 mmol/L (ref 3.5–5.1)
POTASSIUM: 4.7 mmol/L (ref 3.5–5.1)
Potassium: 5.3 mmol/L — ABNORMAL HIGH (ref 3.5–5.1)
Potassium: 5 mmol/L (ref 3.5–5.1)
SODIUM: 121 mmol/L — AB (ref 135–145)
Sodium: 123 mmol/L — ABNORMAL LOW (ref 135–145)
Sodium: 122 mmol/L — ABNORMAL LOW (ref 135–145)
Sodium: 123 mmol/L — ABNORMAL LOW (ref 135–145)

## 2016-05-16 LAB — CBC WITH DIFFERENTIAL/PLATELET
BASOS ABS: 0 10*3/uL (ref 0.0–0.1)
BASOS PCT: 0 %
Eosinophils Absolute: 0 10*3/uL (ref 0.0–0.7)
Eosinophils Relative: 0 %
HEMATOCRIT: 35.6 % — AB (ref 39.0–52.0)
Hemoglobin: 12.4 g/dL — ABNORMAL LOW (ref 13.0–17.0)
LYMPHS PCT: 6 %
Lymphs Abs: 0.6 10*3/uL — ABNORMAL LOW (ref 0.7–4.0)
MCH: 33 pg (ref 26.0–34.0)
MCHC: 34.8 g/dL (ref 30.0–36.0)
MCV: 94.7 fL (ref 78.0–100.0)
Monocytes Absolute: 0.9 10*3/uL (ref 0.1–1.0)
Monocytes Relative: 9 %
NEUTROS ABS: 8.5 10*3/uL — AB (ref 1.7–7.7)
Neutrophils Relative %: 86 %
PLATELETS: 115 10*3/uL — AB (ref 150–400)
RBC: 3.76 MIL/uL — AB (ref 4.22–5.81)
RDW: 15.3 % (ref 11.5–15.5)
WBC: 9.9 10*3/uL (ref 4.0–10.5)

## 2016-05-16 LAB — URINE CULTURE: CULTURE: NO GROWTH

## 2016-05-16 LAB — RESPIRATORY PANEL BY PCR
ADENOVIRUS-RVPPCR: NOT DETECTED
Bordetella pertussis: NOT DETECTED
CHLAMYDOPHILA PNEUMONIAE-RVPPCR: NOT DETECTED
CORONAVIRUS 229E-RVPPCR: NOT DETECTED
CORONAVIRUS NL63-RVPPCR: NOT DETECTED
Coronavirus HKU1: NOT DETECTED
Coronavirus OC43: NOT DETECTED
INFLUENZA A-RVPPCR: NOT DETECTED
Influenza B: NOT DETECTED
MYCOPLASMA PNEUMONIAE-RVPPCR: NOT DETECTED
Metapneumovirus: NOT DETECTED
PARAINFLUENZA VIRUS 1-RVPPCR: NOT DETECTED
PARAINFLUENZA VIRUS 4-RVPPCR: NOT DETECTED
Parainfluenza Virus 2: NOT DETECTED
Parainfluenza Virus 3: NOT DETECTED
Respiratory Syncytial Virus: NOT DETECTED
Rhinovirus / Enterovirus: NOT DETECTED

## 2016-05-16 LAB — BLOOD GAS, ARTERIAL
ACID-BASE DEFICIT: 7 mmol/L — AB (ref 0.0–2.0)
Acid-base deficit: 6.9 mmol/L — ABNORMAL HIGH (ref 0.0–2.0)
Bicarbonate: 17.3 mmol/L — ABNORMAL LOW (ref 20.0–28.0)
Bicarbonate: 18.1 mmol/L — ABNORMAL LOW (ref 20.0–28.0)
DRAWN BY: 448981
DRAWN BY: 235321
FIO2: 30
FIO2: 30
MECHVT: 600 mL
O2 SAT: 97.6 %
O2 SAT: 83.2 %
PATIENT TEMPERATURE: 100.7
PCO2 ART: 33.9 mmHg (ref 32.0–48.0)
PEEP/CPAP: 5 cmH2O
PEEP: 5 cmH2O
PH ART: 7.335 — AB (ref 7.350–7.450)
PH ART: 7.309 — AB (ref 7.350–7.450)
Patient temperature: 98.6
RATE: 14 resp/min
RATE: 14 resp/min
VT: 510 mL
pCO2 arterial: 37.2 mmHg (ref 32.0–48.0)
pO2, Arterial: 119 mmHg — ABNORMAL HIGH (ref 83.0–108.0)
pO2, Arterial: 55.2 mmHg — ABNORMAL LOW (ref 83.0–108.0)

## 2016-05-16 LAB — GLUCOSE, CAPILLARY
GLUCOSE-CAPILLARY: 330 mg/dL — AB (ref 65–99)
GLUCOSE-CAPILLARY: 289 mg/dL — AB (ref 65–99)
Glucose-Capillary: 307 mg/dL — ABNORMAL HIGH (ref 65–99)
Glucose-Capillary: 339 mg/dL — ABNORMAL HIGH (ref 65–99)
Glucose-Capillary: 287 mg/dL — ABNORMAL HIGH (ref 65–99)
Glucose-Capillary: 334 mg/dL — ABNORMAL HIGH (ref 65–99)

## 2016-05-16 LAB — COOXEMETRY PANEL
Carboxyhemoglobin: 1.2 % (ref 0.5–1.5)
METHEMOGLOBIN: 0.7 % (ref 0.0–1.5)
O2 Saturation: 69 %
Total hemoglobin: 12.4 g/dL (ref 12.0–16.0)

## 2016-05-16 LAB — HEPATIC FUNCTION PANEL
ALBUMIN: 3.1 g/dL — AB (ref 3.5–5.0)
ALT: 4438 U/L — AB (ref 17–63)
AST: 5043 U/L — ABNORMAL HIGH (ref 15–41)
Alkaline Phosphatase: 77 U/L (ref 38–126)
BILIRUBIN DIRECT: 3.5 mg/dL — AB (ref 0.1–0.5)
Indirect Bilirubin: 2.8 mg/dL — ABNORMAL HIGH (ref 0.3–0.9)
Total Bilirubin: 6.3 mg/dL — ABNORMAL HIGH (ref 0.3–1.2)
Total Protein: 6.4 g/dL — ABNORMAL LOW (ref 6.5–8.1)

## 2016-05-16 LAB — DIGOXIN LEVEL: DIGOXIN LVL: 1.5 ng/mL (ref 0.8–2.0)

## 2016-05-16 LAB — HEPARIN LEVEL (UNFRACTIONATED): HEPARIN UNFRACTIONATED: 0.64 [IU]/mL (ref 0.30–0.70)

## 2016-05-16 LAB — CULTURE, BLOOD (ROUTINE X 2): Culture: NO GROWTH

## 2016-05-16 LAB — PROCALCITONIN: PROCALCITONIN: 14.43 ng/mL

## 2016-05-16 LAB — LACTIC ACID, PLASMA
LACTIC ACID, VENOUS: 2.5 mmol/L — AB (ref 0.5–1.9)
LACTIC ACID, VENOUS: 2.2 mmol/L — AB (ref 0.5–1.9)

## 2016-05-16 LAB — MAGNESIUM: Magnesium: 1.9 mg/dL (ref 1.7–2.4)

## 2016-05-16 LAB — VANCOMYCIN, RANDOM: VANCOMYCIN RM: 12

## 2016-05-16 LAB — PHOSPHORUS: PHOSPHORUS: 4.4 mg/dL (ref 2.5–4.6)

## 2016-05-16 LAB — CORTISOL: CORTISOL PLASMA: 99.8 ug/dL

## 2016-05-16 MED ORDER — ORAL CARE MOUTH RINSE
15.0000 mL | Freq: Four times a day (QID) | OROMUCOSAL | Status: DC
  Administered 2016-05-17 (×4): 15 mL via OROMUCOSAL

## 2016-05-16 MED ORDER — IPRATROPIUM BROMIDE 0.02 % IN SOLN
0.5000 mg | Freq: Three times a day (TID) | RESPIRATORY_TRACT | Status: DC
  Administered 2016-05-16 – 2016-05-17 (×4): 0.5 mg via RESPIRATORY_TRACT
  Filled 2016-05-16 (×4): qty 2.5

## 2016-05-16 MED ORDER — VITAL 1.5 CAL PO LIQD
1000.0000 mL | ORAL | Status: DC
  Administered 2016-05-16 – 2016-05-17 (×2): 1000 mL
  Filled 2016-05-16 (×3): qty 1000

## 2016-05-16 MED ORDER — SODIUM CHLORIDE 0.9 % IV SOLN
5.0000 mg/h | INTRAVENOUS | Status: DC
  Filled 2016-05-16: qty 10

## 2016-05-16 MED ORDER — VANCOMYCIN HCL IN DEXTROSE 750-5 MG/150ML-% IV SOLN
750.0000 mg | Freq: Two times a day (BID) | INTRAVENOUS | Status: DC
  Administered 2016-05-16 – 2016-05-17 (×2): 750 mg via INTRAVENOUS
  Filled 2016-05-16 (×2): qty 150

## 2016-05-16 MED ORDER — ENOXAPARIN SODIUM 30 MG/0.3ML ~~LOC~~ SOLN
30.0000 mg | SUBCUTANEOUS | Status: DC
  Administered 2016-05-16 – 2016-05-17 (×2): 30 mg via SUBCUTANEOUS
  Filled 2016-05-16 (×2): qty 0.3

## 2016-05-16 MED ORDER — SODIUM CHLORIDE 0.9 % IV SOLN
INTRAVENOUS | Status: DC
  Administered 2016-05-17: 2 [IU]/h via INTRAVENOUS
  Filled 2016-05-16 (×2): qty 2.5

## 2016-05-16 MED ORDER — SODIUM CHLORIDE 0.9 % IV SOLN
0.0000 mg/h | INTRAVENOUS | Status: DC
  Administered 2016-05-16: 2 mg/h via INTRAVENOUS
  Administered 2016-05-17 (×2): 4 mg/h via INTRAVENOUS
  Filled 2016-05-16 (×3): qty 10

## 2016-05-16 MED ORDER — VASOPRESSIN 20 UNIT/ML IV SOLN
0.0300 [IU]/min | INTRAVENOUS | Status: DC
  Administered 2016-05-16 – 2016-05-17 (×2): 0.03 [IU]/min via INTRAVENOUS
  Filled 2016-05-16 (×2): qty 2

## 2016-05-16 MED ORDER — PIPERACILLIN-TAZOBACTAM IN DEX 2-0.25 GM/50ML IV SOLN
2.2500 g | Freq: Four times a day (QID) | INTRAVENOUS | Status: DC
  Administered 2016-05-16 – 2016-05-17 (×5): 2.25 g via INTRAVENOUS
  Filled 2016-05-16 (×6): qty 50

## 2016-05-16 MED ORDER — CHLORHEXIDINE GLUCONATE 0.12% ORAL RINSE (MEDLINE KIT)
15.0000 mL | Freq: Two times a day (BID) | OROMUCOSAL | Status: DC
Start: 2016-05-16 — End: 2016-05-18
  Administered 2016-05-16 – 2016-05-17 (×2): 15 mL via OROMUCOSAL

## 2016-05-16 MED ORDER — VECURONIUM BROMIDE 10 MG IV SOLR: 5.0000 mg | INTRAVENOUS | Status: DC | PRN

## 2016-05-16 MED ORDER — HYDROCORTISONE NA SUCCINATE PF 100 MG IJ SOLR
50.0000 mg | Freq: Four times a day (QID) | INTRAMUSCULAR | Status: DC
  Administered 2016-05-16 – 2016-05-17 (×5): 50 mg via INTRAVENOUS
  Filled 2016-05-16 (×5): qty 2

## 2016-05-16 MED ORDER — SODIUM POLYSTYRENE SULFONATE 15 GM/60ML PO SUSP
30.0000 g | Freq: Once | ORAL | Status: AC
  Administered 2016-05-16: 30 g
  Filled 2016-05-16: qty 120

## 2016-05-16 MED ORDER — PRO-STAT SUGAR FREE PO LIQD
30.0000 mL | Freq: Every day | ORAL | Status: DC
  Administered 2016-05-17: 30 mL
  Filled 2016-05-16 (×2): qty 30

## 2016-05-16 MED ORDER — SODIUM CHLORIDE 0.9 % IV BOLUS (SEPSIS)
1000.0000 mL | Freq: Once | INTRAVENOUS | Status: AC
  Administered 2016-05-16: 1000 mL via INTRAVENOUS

## 2016-05-16 NOTE — Assessment & Plan Note (Signed)
New fever 105F. Does not seem med related  Plan - cooling blanket - pan culture  - RVP =- sepsis biomarker check  - continue zosyn - add vanc -

## 2016-05-16 NOTE — Progress Notes (Deleted)
eLink Physician-Brief Progress Note Patient Name: Ebbie RidgeJohn W Brickner DOB: Dec 26, 1945 MRN: [id number]   Date of Service  05/16/2016  HPI/Events of Note  Shifting to comfort care for extubation    Morphine drip start at 5 mg per hour and titrate to effect         Sandrea HughsMichael Wert 05/16/2016, 6:29 PM

## 2016-05-16 NOTE — Assessment & Plan Note (Addendum)
Still with significant vent dyssynchrony.    Plan: Continue fentanyl infusion and versed infusion titrated to RASS -3 Daily WUA

## 2016-05-16 NOTE — Assessment & Plan Note (Addendum)
Severe cardiogenic shock, systolic heart failure.  Not improved, prognosis grim.  Plan: Check Coox CVP monitoring Continue levophed titrated to Maintain MAP > 65 Continue vasopressin for MAP > 65

## 2016-05-16 NOTE — Progress Notes (Signed)
ANTICOAGULATION CONSULT NOTE - Initial Consult  Pharmacy Consult for enoxaparin Indication: VTE prophylaxis  Allergies  Allergen Reactions  . Sulfasalazine Rash    Patient Measurements: Height: 5\' 6"  (167.6 cm) Weight: 173 lb 8 oz (78.7 kg) IBW/kg (Calculated) : 63.8 Heparin Dosing Weight:   Vital Signs: Temp: 103 F (39.4 C) (02/25 0800) Temp Source: Axillary (02/25 0800) BP: 99/61 (02/25 0800) Pulse Rate: 118 (02/25 0800)  Labs:  Recent Labs  05/14/16 1007  05/14/16 1516 05/14/16 2219 05/15/16 0222 05/15/16 0838 05/15/16 1113 05/15/16 1827 05/16/16 0544 05/16/16 0807  HGB 13.3  --   --   --  13.0  --   --   --  12.4*  --   HCT 38.5*  --   --   --  38.6*  --   --   --  35.6*  --   PLT 123*  --   --   --  157  --   --   --  115*  --   LABPROT 18.5*  --   --   --   --   --   --   --   --   --   INR 1.53  --   --   --   --   --   --   --   --   --   HEPARINUNFRC  --   < > 0.13* 0.32 0.13*  --  0.22* 0.37 0.64  --   CREATININE  --   --   --   --   --  2.69*  --   --  4.14* 4.46*  TROPONINI  --   < > 0.17* 0.29* 0.48*  --   --   --   --   --   < > = values in this interval not displayed.  Estimated Creatinine Clearance: 15.4 mL/min (by C-G formula based on SCr of 4.46 mg/dL (H)).   Medical History: Past Medical History:  Diagnosis Date  . Allergic rhinitis due to pollen   . COPD (chronic obstructive pulmonary disease) (HCC)   . Coronary artery disease, occlusive   . GERD (gastroesophageal reflux disease)   . Hyperlipidemia   . Hypertension   . Type II or unspecified type diabetes mellitus with neurological manifestations, not stated as uncontrolled(250.60)     Medications:  Scheduled:  . aspirin  81 mg Per Tube QPM  . Chlorhexidine Gluconate Cloth  6 each Topical Q0600  . cholecalciferol  1,000 Units Oral Daily  . [START ON 05/14/2016] feeding supplement (PRO-STAT SUGAR FREE 64)  30 mL Per Tube Daily  . fluticasone  1 spray Each Nare Daily  .  hydrocortisone sod succinate (SOLU-CORTEF) inj  50 mg Intravenous Q6H  . insulin aspart  0-20 Units Subcutaneous Q4H  . ipratropium  0.5 mg Nebulization QID  . levalbuterol  0.63 mg Nebulization TID  . pantoprazole sodium  40 mg Per Tube Daily  . piperacillin-tazobactam (ZOSYN)  IV  2.25 g Intravenous Q6H  . sodium chloride flush  3 mL Intravenous Q12H    Assessment: Pharmacy is consulted to dose enoxaparin in 71 yo male. Pt was previously on heparin drip. Pt has been in sinus tach for at least 24 hours, so drip was stopped. Pt being transitioned to enoxaparin.  Goal of Therapy:  Anti-Xa level 0.6-1 units/ml 4hrs after LMWH dose given Monitor platelets by anticoagulation protocol: Yes   Plan:   Lovenox 30 mg SQ q24h  Monitor for  renal function as pt CrCl currently is about 15 ml/min  Monitor for signs and symptoms of bleeding or thrombosis.   Adalberto Cole, PharmD, BCPS Pager [phone] 05/16/2016 10:43 AM

## 2016-05-16 NOTE — Progress Notes (Signed)
Patient has had decreased urine output. Urine output since 0700 is 53cc.Orders to be added for bolus and repeat lab work.

## 2016-05-16 NOTE — Progress Notes (Signed)
CRITICAL VALUE ALERT  Critical value received: Lactic Acid 2.5  Date of notification:  05/16/2016  Time of notification: 0855  Critical value read back:Yes.    Nurse who received alert:  Drue Stageraroline Fount Bahe,. RN  MD notified (1st page):  Ramaswamy. MD currently at bedside at made aware. Per MD redraw will be done at 1600

## 2016-05-16 NOTE — Assessment & Plan Note (Signed)
On levophed 9. 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

## 2016-05-16 NOTE — Progress Notes (Signed)
CRITICAL VALUE ALERT  Critical value received:  Lactic acid 2.2  Date of notification:  05/16/2016  Time of notification:  1540  Critical value read back:Yes.    Nurse who received alert:  Drue Stageraroline Zyara Riling, RN  MD notified (1st page):  Wert (Elink) spoke with at 1540. No new orders or interventions at this time.   MD also made aware of BMET and ABG results available.

## 2016-05-16 NOTE — Assessment & Plan Note (Addendum)
Worsening.  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 Prognosis dismal.  CVVHD would not help, only manage electrolytes, fluids, etc.  He is a terrible candidate for long term dialysis.  Plan Will discuss goals of care with daughter today

## 2016-05-16 NOTE — Progress Notes (Addendum)
PULMONARY / CRITICAL CARE MEDICINE   Name: Howard Dixon MRN: [id number] DOB: 05-19-1945    ADMISSION DATE:  06-09-2016 CONSULTATION DATE:  05/14/16  REFERRING MD:  Dr. Gwenlyn Perking   CHIEF COMPLAINT:  Respiratory distress, hypotension   brief 71 y/o M who presented 2/20 to [facility] via EMS with complaints of lower epigastric pain, chest tightness, SOB, 1 episode of vomiting & constipation.   On admit, the patient reported he had attempted miralax without relief.  He also had noted increased lower extremity swelling and 5-6 lb weight gain.  He has a known hx of CAD, COPD, DM, former ETOH abuse (30 years ago), HTN, CHF (LVEF 25-30%), ongoing tobacco abuse and aortic aneurysm.  He had a low dose CT screen for lung cancer and the aneurysm was incidentally found > he is followed at [facility] for the same and has been told he would be a poor surgical candidate.  Initial work up included a negative EKG, troponin of 0.08, hyponatremia, elevated transaminases (AST 2,242 / ALT 2,232)  and AKI.  CT of the abdomen was evaluated and demonstrated an enlarged L lobe of liver concerning for early cirrhosis, gallbladder sludge vs small stones, 7 mm non-obstructing R renal stone without hydronephrosis, small ascites, sigmoid diverticulosis, no bowel obstruction & small R effusion.  He was admitted by [facility] for concerns of hepatorenal syndrome.  The patient was diuresed with improvement in renal function (4.2L negative balance for admit).  Hepatitis panel was evaluated and negative.  Overnight 2/23 he developed atrial flutter and was placed on amiodarone.  Cardiology was consulted.  Afternoon of 2/23, the patient became agitated and restless.  He also developed hypotension.  ABG assessed > 7.463 / 20 / 531 / 14.2.  The patient was treated with low dose beta blocker (lopressor 2.5mg ) for rate.  Unfortunately, the rate was difficult to control.  He acutely decompensated with near respiratory arrest.    PCCM consulted for emergent  intubation / evaluation.   STUDIES:  2/20  CT ABD/Pelvis >> enlarged L lobe of liver concerning for early cirrhosis, gallbladder sludge vs small stones, 7 mm non-obstructing R renal stone without hydronephrosis, small ascites, sigmoid diverticulosis, no bowel obstruction, small R effusion 2/20 LE Duplex >> negative for DVT 2/21 ECHO >> LV mildly dilated, LVEF 20%, diffuse hypokinesis, no LV thrombus, grade II diastolic dysfunction, moderate AS, PA Peak 56   CULTURES: BCx2 2/20 >> 1/2 with coag neg staph  ANTIBIOTICS:   LINES/TUBES: ETT 2/23 >> R IJ TLC 2/23 >>  SIGNIFICANT EVENTS: 2/20  Admit with sCHF  2/21 - echo ef 20% 2/23  PCCM consulted for hypotension, AMS, near resp arrest 2/24 - intubated. On levophed gtt, amio gtt, heparing tt, fent gtt. RASS -2. Able to track. RN reports high sugars   SUBJECTIVE/OVERNIGHT/INTERVAL HX 2/25 - febrile,. Levo at 9. Creat up at 4. Sinus tach - still on amio gtt and IV heparin gtt. 30% fio2 . Needing cooling blanket. Not on insulin gtt. No family at bedside. Worsening jaundice  VITAL SIGNS: BP 102/60   Pulse (!) 118   Temp (!) 102.8 F (39.3 C) (Axillary)   Resp 17   Ht 5\' 6"  (1.676 m)   Wt 78.7 kg (173 lb 8 oz)   SpO2 100%   BMI 28.00 kg/m   HEMODYNAMICS:    VENTILATOR SETTINGS: Vent Mode: PRVC FiO2 (%):  [30 %-40 %] 30 % Set Rate:  [14 bmp] 14 bmp Vt Set:  [510 mL] 510 mL  PEEP:  [5 cmH20] 5 cmH20 Pressure Support:  [8 cmH20] 8 cmH20 Plateau Pressure:  [10 cmH20-21 cmH20] 19 cmH20  INTAKE / OUTPUT: I/O last 3 completed shifts: In: 4433.4 [I.V.:2503.4; NG/GT:1480; IV Piggyback:450] Out: 1560 [Urine:1510; Emesis/NG output:50]  PHYSICAL EXAMINATION:   General Appearance:    Looks criticall ill OBESE - no  Head:    Normocephalic, without obvious abnormality, atraumatic  Eyes:    PERRL - yes, conjunctiva/corneas - mild jaundice +  Ears:    Normal external ear canals, both ears  Nose:   NG tube - no  Throat:  ETT TUBE  - yes , OG tube - yes with TF  Neck:   Supple,  No enlargement/tenderness/nodules     Lungs:     Clear to auscultation bilaterally, Ventilator   Synchrony - yes but air hungry today  Chest wall:    No deformity  Heart:    S1 and S2 normal, no murmur, CVP - na.  Pressors - levophed at 9  Abdomen:     Soft, no masses, no organomegaly  Genitalia:    Foley +. Scrotum look s  Rectal:   not done  Extremities:   Extremities- no cyanosis, no clubbing     Skin:   Intact in exposed areas . Sacral area - none reported per RN     Neurologic:   Sedation - fent gtt -> RASS - -3 . Moves all 4s - yes during wua. CAM-ICU - pending . Orientation - pending       LABS: PULMONARY  Recent Labs Lab 05/14/16 1212 05/14/16 1425 05/15/16 0326  PHART 7.463* 7.267* 7.397  PCO2ART 20.1* 27.0* 28.3*  PO2ART 531* 460* 208*  HCO3 14.2* 11.9* 17.1*  O2SAT ABOVE REPORTABLE RANGE 99.7 99.5    CBC  Recent Labs Lab 05/14/16 1007 05/15/16 0222 05/16/16 0544  HGB 13.3 13.0 12.4*  HCT 38.5* 38.6* 35.6*  WBC 10.9* 17.1* 9.9  PLT 123* 157 115*    COAGULATION  Recent Labs Lab 05-21-2016 0650 05/14/16 1007  INR 1.79 1.53    CARDIAC    Recent Labs Lab 05/21/2016 2316 05/14/16 1204 05/14/16 1516 05/14/16 2219 05/15/16 0222  TROPONINI 0.16* 0.12* 0.17* 0.29* 0.48*   No results for input(s): PROBNP in the last 168 hours.   CHEMISTRY  Recent Labs Lab 2016-05-21 1255 05/12/16 0528 05/13/16 0529 05/14/16 0455 05/14/16 1516 05/15/16 0838 05/15/16 1648 05/16/16 0544  NA  --  130* 131* 128*  --  121*  --  123*  K  --  3.9 3.3* 4.2  --  5.3*  --  5.0  CL  --  94* 94* 92*  --  87*  --  87*  CO2  --  25 25 22   --  21*  --  20*  GLUCOSE  --  150* 155* 193*  --  239*  --  281*  BUN  --  67* 51* 43*  --  59*  --  83*  CREATININE  --  2.08* 1.57* 1.69*  --  2.69*  --  4.14*  CALCIUM  --  9.3 9.2 9.4  --  8.5*  --  8.1*  MG 2.5*  --   --   --  1.9 1.9 1.9 1.9  PHOS  --   --   --   --  6.5*  5.5* 5.0* 4.4   Estimated Creatinine Clearance: 16.6 mL/min (by C-G formula based on SCr of 4.14 mg/dL (H)).   LIVER  Recent  Labs Lab 03-Jun-2016 0045 06-03-16 0650 05/14/16 0455 05/14/16 1007 05/16/16 0544  AST 2,242*  --  432*  --  PENDING  ALT 2,232*  --  1,060*  --  PENDING  ALKPHOS 74  --  66  --  77  BILITOT 2.6*  --  3.9*  --  6.3*  PROT 7.8  --  7.2  --  6.4*  ALBUMIN 4.1  --  3.8  --  3.1*  INR  --  1.79  --  1.53  --      INFECTIOUS No results for input(s): LATICACIDVEN, PROCALCITON in the last 168 hours.   ENDOCRINE CBG (last 3)   Recent Labs  05/15/16 1943 05/16/16 0003 05/16/16 0435  GLUCAP 247* 274* 307*     cxr 2/25 - Rt sided infiltrates +    IMAGING x48h  - image(s) personally visualized  -   highlighted in bold Dg Abd 1 View  Result Date: 05/14/2016 CLINICAL DATA:  Intubation EXAM: ABDOMEN - 1 VIEW COMPARISON:  Abdominal CT from 3 days ago FINDINGS: Nasogastric tube tip overlaps the proximal stomach; the side port is at the GE junction. Normal bowel gas pattern. IMPRESSION: Nasogastric tube tip projects over the proximal stomach. The side port is over the GE junction. Electronically Signed   By: Marnee Spring M.D.   On: 05/14/2016 14:37   Dg Chest Port 1 View  Result Date: 05/15/2016 CLINICAL DATA:  Initial evaluation for acute respiratory failure, hypoxia, COPD. EXAM: PORTABLE CHEST 1 VIEW COMPARISON:  Prior radiograph from 05/14/2016. FINDINGS: Patient is intubated with the tip of an endotracheal tube positioned approximately 4 cm above the carina. Enteric tube courses in the the abdomen. Right IJ approach central venous catheter remains in place with tip overlying the distal SVC. Cardiomegaly stable. Mediastinal silhouette within normal limits. Aortic atherosclerosis noted. Lungs mildly hypoinflated. There has been mild interval worsening and pulmonary interstitial edema and congestion as compared to previous. Trace left pleural effusion is  present. Mild scattered atelectatic changes at the left lung base. No new focal infiltrates. No pneumothorax. Osseous structures unchanged. IMPRESSION: 1. Tip of the endotracheal tube 4 cm above the carina. Remaining support apparatus as above. 2. Mild interval worsening and pulmonary interstitial edema as compared to 2/20 3/18. 3. Trace left pleural effusion. Electronically Signed   By: Rise Mu M.D.   On: 05/15/2016 03:07   Dg Chest Port 1 View  Result Date: 05/14/2016 CLINICAL DATA:  Intubation EXAM: PORTABLE CHEST 1 VIEW COMPARISON:  05/14/2016 FINDINGS: Endotracheal tube in the left main bronchus. Recommend withdrawal 5 cm for optimum position of the trachea. Right jugular central venous catheter tip in the SVC. No pneumothorax NG tube in the stomach Mild atelectasis in the lung bases. IMPRESSION: Endotracheal tube in the left main bronchus, recommend withdrawal of 5 cm Central venous catheter tip in the SVC.  No pneumothorax Mild bibasilar atelectasis. These results were called by telephone at the time of interpretation on 05/14/2016 at 2:41 pm to [facility], [place] , who verbally acknowledged these results. Electronically Signed   By: Marlan Palau M.D.   On: 05/14/2016 14:41   Dg Chest Port 1 View  Result Date: 05/14/2016 CLINICAL DATA:  Shortness of breath.  Cough.  COPD. EXAM: PORTABLE CHEST 1 VIEW COMPARISON:  06/03/16 FINDINGS: The cardiac silhouette is mildly enlarged. Aortic atherosclerosis is again noted. There is chronic peribronchial thickening which has slightly decreased. There is minimal asymmetric opacity in the left lung base which does not  appear increased from the prior study. There is blunting of the left lateral costophrenic sulcus which may reflect a new small pleural effusion. Subcentimeter nodular density in the right lung base is unchanged. No pneumothorax. Mild thoracic spondylosis. Old left clavicle fracture. IMPRESSION: 1. Persistent mild left basilar opacity which may  reflect atelectasis although early infection is not excluded. 2. Possible small left pleural effusion. 3. COPD. 4. Aortic atherosclerosis. Electronically Signed   By: Sebastian AcheAllen  Grady M.D.   On: 05/14/2016 12:16      DISCUSSION: 71 y/o M admitted with abdominal pain, SOB. Work up concerning for decompensation of sCHF, possible hepatorenal syndrome.  Developed atrial flutter and later acutely decompensated 2/23, intubated.        ASSESSMENT / PLAN:   ASSESSMENT and PLAN  Acute respiratory failure with hypoxia (HCC) with underlying COPD and tobacco use 05/15/2016 - on 40% fio2 with good vent synchrony but does not meet SBT criteria due to septic shock. ET tube 4cm above carina  Plan  = advance et tube 1 cm - full vent support - no extubation - BD  Acute hepatitis 05/15/2016 - shock liver. Improved as of yesterday  Plan - recheck LFT  Acute on chronic systolic CHF (congestive heart failure) (HCC) 05/15/2016 - circulatory shock.Trop profle only c/w demand ischemia  PLAN  - MAP goal > 65 - Migh needs SCVO2 monitoring  Atrial fibrillation, new onset (HCC) 05/15/2016 - in sinus with amio gtt and heparin gtt ongoing and on po gigoxin and   Plan  - per cardiology - dc lopressor given shiock  GERD (gastroesophageal reflux disease) contnue TF and PPI  Type 2 diabetes, controlled, with neuropathy (HCC) 05/15/2016 - rn reports hyperglycemia  Plan  - start phase 2 insulin gprotocol  Electrolyte imbalance - low sodium yesterday and mild low mag yesterday. Today's lab pending  Plan  - await 05/15/2016 labs  Acute hepatitis Liver enzymes pending appears to have woresning jaundice though. Given high fever 105F suspecte acute choleycystitis  Plan Dc tylenol RUQ US urgent   Acute kidney injury (HCC) Startd 2/24 . Worse 05/16/2016 with K 5.0. Making urine though  Plan  - stat abg  - kayexalate x 1 - check bmet again 3pm - dc digoxin - due to risk for dig toxicity; stat dig  level - low th reshold to call renal  Acute on chronic systolic CHF (congestive heart failure) (HCC) ECHO 2/21  - > LV mildly dilated, LVEF 20%, diffuse hypokinesis, no LV thrombus, grade II diastolic dysfunction, moderate AS, PA Peak 56  Currenty in circulatory shock  Plan  - cards following  Acute renal failure superimposed on stage 3 chronic kidney disease (HCC)  Worse 05/16/2016 with K 5.0. Making urine though  Plan  - stat abg  - kayexalate x 1 - check bmet again 3pm - dc digoxin - due to risk for dig toxicity; stat dig level - low th reshold to call renal  Acute respiratory failure with hypoxia (HCC) with underlying COPD and tobacco use On 30% fio2 but very air hungry . Lik,ey due to metabolic acidosis  Plan Check abg cxr 05/14/2016 Full vent support No SBT 05/16/2016   Atrial fibrillation, new onset (HCC) In Sinus tach x 24h atleast  Plan Dc amio gtt Dc IV heparin gtt DC dig Cards can restart if needed  Electrolyte imbalance Rising K 5.0  Plan Kayexalate Recheck bmet 2p  Type 2 diabetes, controlled, with neuropathy (HCC) ssi icu phase 1  [place] Agitation  Sedation Scale (RASS) score  in ICU On fent gtt RASS -3  PlAN maintaiin rass -3 given critical illlness  GERD (gastroesophageal reflux disease) ppi  Severe sepsis (HCC) New fever 105F. Does not seem med related  Plan - cooling blanket - pan culture  - RVP =- sepsis biomarker check  - continue zosyn - add vanc -   Shock circulatory (HCC) On levophed 9. Likely septic with septic cardiomyopathyc  Plan Check coox - if very low add dobutamine but cuation with hx of A fib  MAP goal > 65      FAMILY  - Updates: 05/16/2016 --> none at bedsiode. Very ill patient  - Inter-disciplinary family meet or Palliative Care meeting due by:  DAy 7. Current LOS is LOS 5 days  CODE STATUS    Code Status Orders        Start     Ordered   05/08/2016 1134  Full code  Continuous     05/08/2016  1133    Code Status History    Date Active Date Inactive Code Status Order ID Comments User Context   07/28/2015  8:51 PM 07/30/2015  5:57 PM Full Code [id number]  Eduard Clos, MD ED        DISPO Keep in ICU       The patient is critically ill with multiple organ systems failure and requires high complexity decision making for assessment and support, frequent evaluation and titration of therapies, application of advanced monitoring technologies and extensive interpretation of multiple databases.   Critical Care Time devoted to patient care services described in this note is  40  Minutes. This time reflects time of care of this signee Dr Kalman Shan. This critical care time does not reflect procedure time, or teaching time or supervisory time of PA/NP/Med student/Med Resident etc but could involve care discussion time    Dr. Kalman Shan, M.D., [facility].C.P Pulmonary and Critical Care Medicine Staff Physician [facility] [place] [facility] Pager: [phone], If no answer or between  15:00h - 7:00h: call [phone]  05/16/2016 7:33 AM

## 2016-05-16 NOTE — Consult Note (Signed)
Ref: Tillman Abide, MD   Subjective:  Intubated. Decreased urine output and worsening renal function. Sinus tachycardia continues. T max 103 degree F. FiO2 down to 30 %.  Objective:  Vital Signs in the last 24 hours: Temp:  [100.7 F (38.2 C)-103 F (39.4 C)] 100.7 F (38.2 C) (02/25 1200) Pulse Rate:  [49-120] 56 (02/25 1524) Cardiac Rhythm: Sinus tachycardia (02/25 1500) Resp:  [10-23] 22 (02/25 1524) BP: (84-111)/(51-69) 95/65 (02/25 1524) SpO2:  [93 %-100 %] 95 % (02/25 1524) FiO2 (%):  [30 %-40 %] 30 % (02/25 1524) Weight:  [78.7 kg (173 lb 8 oz)] 78.7 kg (173 lb 8 oz) (02/25 0500)  Physical Exam: BP Readings from Last 1 Encounters:  05/16/16 95/65    Wt Readings from Last 1 Encounters:  05/16/16 78.7 kg (173 lb 8 oz)    Weight change: 2 kg (4 lb 6.5 oz) Body mass index is 28 kg/m. HEENT: [place]/AT, Eyes-Conjunctiva-Pink, Sclera-Non-icteric. Intubated Neck: No JVD, No bruit, Trachea midline. Lungs:  Clearing, Bilateral. Cardiac:  Regular rhythm, normal S1 and S2, no S3. II/VI systolic murmur. Abdomen:  Soft, non-tender. BS present. Extremities:  Trace edema present. No cyanosis. No clubbing. CNS: AxOx0, Cranial nerves grossly intact.  Skin: Warm and dry.   Intake/Output from previous day: 02/24 0701 - 02/25 0700 In: 2794.4 [I.V.:1304.4; NG/GT:1040; IV Piggyback:450] Out: 855 [Urine:805; Emesis/NG output:50]    Lab Results: BMET    Component Value Date/Time   NA 123 (L) 05/16/2016 1426   NA 122 (L) 05/16/2016 0807   NA 123 (L) 05/16/2016 0544   K 4.7 05/16/2016 1426   K 5.0 05/16/2016 0807   K 5.0 05/16/2016 0544   CL 88 (L) 05/16/2016 1426   CL 86 (L) 05/16/2016 0807   CL 87 (L) 05/16/2016 0544   CO2 20 (L) 05/16/2016 1426   CO2 20 (L) 05/16/2016 0807   CO2 20 (L) 05/16/2016 0544   GLUCOSE 327 (H) 05/16/2016 1426   GLUCOSE 323 (H) 05/16/2016 0807   GLUCOSE 281 (H) 05/16/2016 0544   BUN 86 (H) 05/16/2016 1426   BUN 80 (H) 05/16/2016 0807   BUN 83 (H)  05/16/2016 0544   CREATININE 4.61 (H) 05/16/2016 1426   CREATININE 4.46 (H) 05/16/2016 0807   CREATININE 4.14 (H) 05/16/2016 0544   CALCIUM 7.5 (L) 05/16/2016 1426   CALCIUM 7.8 (L) 05/16/2016 0807   CALCIUM 8.1 (L) 05/16/2016 0544   GFRNONAA 12 (L) 05/16/2016 1426   GFRNONAA 12 (L) 05/16/2016 0807   GFRNONAA 13 (L) 05/16/2016 0544   GFRAA 14 (L) 05/16/2016 1426   GFRAA 14 (L) 05/16/2016 0807   GFRAA 16 (L) 05/16/2016 0544   CBC    Component Value Date/Time   WBC 9.9 05/16/2016 0544   RBC 3.76 (L) 05/16/2016 0544   HGB 12.4 (L) 05/16/2016 0544   HCT 35.6 (L) 05/16/2016 0544   PLT 115 (L) 05/16/2016 0544   MCV 94.7 05/16/2016 0544   MCH 33.0 05/16/2016 0544   MCHC 34.8 05/16/2016 0544   RDW 15.3 05/16/2016 0544   LYMPHSABS 0.6 (L) 05/16/2016 0544   MONOABS 0.9 05/16/2016 0544   EOSABS 0.0 05/16/2016 0544   BASOSABS 0.0 05/16/2016 0544   HEPATIC Function Panel  Recent Labs  04/24/2016 0045 05/14/16 0455 05/16/16 0544  PROT 7.8 7.2 6.4*   HEMOGLOBIN A1C No components found for: HGA1C,  MPG CARDIAC ENZYMES Lab Results  Component Value Date   CKTOTAL 612 (H) 04/26/2016   TROPONINI 0.48 (HH) 05/15/2016  TROPONINI 0.29 (HH) 05/14/2016   TROPONINI 0.17 (HH) 05/14/2016   BNP No results for input(s): PROBNP in the last 8760 hours. TSH No results for input(s): TSH in the last 8760 hours. CHOLESTEROL  Recent Labs  03/10/16 1236  CHOL 144    Scheduled Meds: . aspirin  81 mg Per Tube QPM  . Chlorhexidine Gluconate Cloth  6 each Topical Q0600  . cholecalciferol  1,000 Units Oral Daily  . enoxaparin (LOVENOX) injection  30 mg Subcutaneous Q24H  . [START ON 07/02/2016] feeding supplement (PRO-STAT SUGAR FREE 64)  30 mL Per Tube Daily  . fluticasone  1 spray Each Nare Daily  . hydrocortisone sod succinate (SOLU-CORTEF) inj  50 mg Intravenous Q6H  . insulin aspart  0-20 Units Subcutaneous Q4H  . ipratropium  0.5 mg Nebulization TID  . levalbuterol  0.63 mg  Nebulization TID  . pantoprazole sodium  40 mg Per Tube Daily  . piperacillin-tazobactam (ZOSYN)  IV  2.25 g Intravenous Q6H  . sodium chloride flush  3 mL Intravenous Q12H   Continuous Infusions: . feeding supplement (VITAL 1.5 CAL)    . fentaNYL infusion INTRAVENOUS 175 mcg/hr (05/16/16 1514)  . norepinephrine (LEVOPHED) Adult infusion 12.053 mcg/min (05/16/16 1500)  . vasopressin (PITRESSIN) infusion - *FOR SHOCK* 0.03 Units/min (05/16/16 1500)   PRN Meds:.sodium chloride, docusate, fentaNYL, fentaNYL (SUBLIMAZE) injection, fentaNYL (SUBLIMAZE) injection, levalbuterol, midazolam, ondansetron (ZOFRAN) IV, sodium chloride flush  Assessment/Plan: Cardiogenic shock Sinus tachycardia from atrial flutter Acute respiratory failure Acute renal failure Severe sepsis Hyperkalemia COPD CAD Aortic stenosis DM, II Tobacco use disorder  Continue medical treatment. Agree with holding digoxin and lopressor.   LOS: 5 days    Orpah CobbAjay Aeron Lheureux  MD  05/16/2016, 4:12 PM

## 2016-05-16 NOTE — Assessment & Plan Note (Addendum)
Startd 2/24 . Worse 05/16/2016 with K 5.0. Making urine though  Plan  - stat abg  - kayexalate x 1 - check bmet again 3pm - dc digoxin - due to risk for dig toxicity; stat dig level - low th reshold to call renal

## 2016-05-16 NOTE — Progress Notes (Signed)
eLink Physician-Brief Progress Note Patient Name: Howard RidgeJohn W Ronan DOB: February 14, 1946 MRN: [id number]   Date of Service  05/16/2016  HPI/Events of Note  Bucking the vent / double clutching despite sedation  eICU Interventions  Increase vt to 600 and use narcuron to see if we can capture his ventilatory drive/ recheck abgs one hour     Intervention Category Major Interventions: Respiratory failure - evaluation and management  Sandrea HughsMichael Willis Holquin 05/16/2016, 7:31 PM

## 2016-05-16 NOTE — Assessment & Plan Note (Signed)
Rising K 5.0  Plan Kayexalate Recheck bmet 2p

## 2016-05-16 NOTE — Progress Notes (Signed)
eLink Physician-Brief Progress Note Patient Name: Howard RidgeJohn W Dixon DOB: 12/24/1945 MRN: [id number]   Date of Service  05/16/2016  HPI/Events of Note  Hyperglycemia - Blood glucose = 330 >> 334 >> 289. Patient on Q 4 hour resistant Novolog SSI.  eICU Interventions  Will order: 1. D/C Q 4 hour resistant Novolog SSI. 2. Insulin IV infusion per ICU protocol.      Intervention Category Major Interventions: Hyperglycemia - active titration of insulin therapy  Lenell AntuSommer,Gladine Plude Eugene 05/16/2016, 11:55 PM

## 2016-05-16 NOTE — Progress Notes (Signed)
Pharmacy Antibiotic Note  Howard RidgeJohn W Dixon is a 71 y.o. male admitted on Oct 22, 2016 with sepsis.  Pharmacy has been consulted for vancomycin/Zosyn dosing.  Pt with acute respiratory failure with hypoxia with spiking fevers.  2/25 pt renal function worsening, SCr now up 4.14.  Plan:  Change Zosyn to 2.25 gr IV q6h due to CrCl less than 20 ml/min  Vancomycin 1250 mg IV x1, Vancomycin level about 24 hours after dose to determine Clearance  Continue Vancomycin at 750mg  q12  Pt renal function has decreased over last few days. Will monitor renal function closely.  Monitor clinical course, renal function, cultures as available   Height: 5\' 6"  (167.6 cm) Weight: 173 lb 8 oz (78.7 kg) IBW/kg (Calculated) : 63.8  Temp (24hrs), Avg:102.3 F (39.1 C), Min:100.7 F (38.2 C), Max:103 F (39.4 C)   Recent Labs Lab 05-09-16 0045  05/14/16 0455 05/14/16 1007 05/15/16 0222 05/15/16 0838 05/16/16 0544 05/16/16 0807 05/16/16 1425 05/16/16 1426 05/16/16 1456  WBC 12.3*  --   --  10.9* 17.1*  --  9.9  --   --   --   --   CREATININE 2.31*  < > 1.69*  --   --  2.69* 4.14* 4.46*  --  4.61*  --   LATICACIDVEN  --   --   --   --   --   --   --  2.5* 2.2*  --   --   VANCORANDOM  --   --   --   --   --   --   --   --   --   --  12  < > = values in this interval not displayed.  Estimated Creatinine Clearance: 14.9 mL/min (by C-G formula based on SCr of 4.61 mg/dL (H)).    Allergies  Allergen Reactions  . Sulfasalazine Rash   Antimicrobials this admission: 2/24 Zosyn >>  2/24 vancomycin >>   Dose adjustments this admission: 2/25: Change Zosyn to 2.25 gr IV q6h due to worsening SCr 2/25 random level 1300 = 12 mcg/ml, continue Vancomcyin  Microbiology results: 2/20 BCx: 1/2 CoNS, diptheroids 2/23 MRSA PCR: negative 2/24 Trach asp: reincubated, mod GPC, mod GPR, abundant GN coccobacillus 2/24 UCx: ng-final 2/24: BCx: ngtd 2/25 Resp panel: negative  Thank you for allowing pharmacy to be  a part of this patient's care.  Otho BellowsGreen, Esperanza Madrazo L PharmD Pager [phone] 05/16/2016, 4:44 PM

## 2016-05-16 NOTE — Progress Notes (Signed)
Initial Nutrition Assessment  INTERVENTION:   Switch formula to Vital 1.5 at goal rate of 60 ml/hr with 30 ml Prostat once daily. Tube feeding regimen provides 2260 kcal (97% of needs), 112g protein and 1100 ml H2O.  RD to continue to monitor  NUTRITION DIAGNOSIS:   Inadequate oral intake related to inability to eat as evidenced by NPO status, other (see comment) (ventilator dependent).  GOAL:   Patient will meet greater than or equal to 90% of their needs  MONITOR:   Vent status, Labs, Weight trends, Skin, I & O's, TF tolerance  REASON FOR ASSESSMENT:   Consult Enteral/tube feeding initiation and management  ASSESSMENT:   71 y/o M who presented 2/20 to [facility] via EMS with complaints of lower epigastric pain, chest tightness, SOB, 1 episode of vomiting & constipation.   Patient previously provided low Na diet education 2/22. Pt intubated 2/23. TF protocol was initiated and pt has been receiving Vital HP @ 40 ml/hr with 30 ml Prostat BID (providing 1160 kcal and 114g protein). Will switch formula to Vital 1.5 to better meet estimated needs.  Nutrition focused physical exam shows no sign of depletion of muscle mass or body fat.  Patient is currently intubated on ventilator support MV: 11.1 L/min Temp (24hrs), Avg:103.3 F (39.6 C), Min:102.5 F (39.2 C), Max:104.7 F (40.4 C)  Labs reviewed: CBGs: 339 Low Na Mg/Phos WNL  2/22: -RD provided "Low Sodium Nutrition Therapy," "Sodium-free Flavoring Tips," and "Sodium Content of Foods" handouts from the Academy of [employer] Dietetics. -Pt reports that he and his wife enjoy eating tacos, usually every other day. Discussed that if this is something he is not willing to change to ensure that he is taking sodium in this item into account and that he will need to make tradeoffs with other foods throughout the day. -Current diet order is 2 gram Na, patient is consuming approximately 25-100% of meals at this time.   Diet Order:   Diet Heart Room service appropriate? Yes; Fluid consistency: Thin  Skin:  Reviewed, no issues  Last BM:  2/20  Height:   Ht Readings from Last 1 Encounters:  05/14/16 5\' 6"  (1.676 m)    Weight:   Wt Readings from Last 1 Encounters:  05/16/16 173 lb 8 oz (78.7 kg)    Ideal Body Weight:  64.5 kg  BMI:  Body mass index is 28 kg/m.  Estimated Nutritional Needs:   Kcal:  2318  Protein:  110-120g  Fluid:  2L/day  EDUCATION NEEDS:   No education needs identified at this time  Tilda FrancoLindsey Seena Ritacco, MS, RD, LDN Pager: [phone] After Hours Pager: [phone]

## 2016-05-16 NOTE — Assessment & Plan Note (Addendum)
Multi-factorial process with air hunger. COPD, CHF and HCAP (RLL and RUL infiltrate seen on my personal review of the 2/25 CXR). Has triggering issues with vent.  Plan Full vent support Increase PEEP to 9 for better triggering Change PRVC to Pressure control for comfort Titrate FiO2 to maintain O2 saturation > 90%

## 2016-05-16 NOTE — Progress Notes (Signed)
Pharmacy Antibiotic Note  Howard Dixon is a 71 y.o. male admitted on 04/29/2016 with sepsis.  Pharmacy has been consulted for vancomycin/Zosyn dosing.  Pt with acute respiratory failure with hypoxia with spiking fevers.  2/25 pt renal function worsening, SCr now up 4.14.   Plan:  Change Zosyn to 2.25 gr IV q6h due to CrCl less than 20 ml/min  Vancomycin 1250 mg IV x1, will order vancomycin level about 24 hours after dose to determine if needs another dose.  Pt renal function has decreased over last few days. Will monitor renal function closely.  Monitor clinical course, renal function, cultures as available   Height: 5\' 6"  (167.6 cm) Weight: 173 lb 8 oz (78.7 kg) IBW/kg (Calculated) : 63.8  Temp (24hrs), Avg:103.1 F (39.5 C), Min:100.8 F (38.2 C), Max:104.7 F (40.4 C)   Recent Labs Lab 05/19/2016 0045 05/12/16 0528 05/13/16 0529 05/14/16 0455 05/14/16 1007 05/15/16 0222 05/15/16 0838 05/16/16 0544  WBC 12.3*  --   --   --  10.9* 17.1*  --  9.9  CREATININE 2.31* 2.08* 1.57* 1.69*  --   --  2.69* 4.14*    Estimated Creatinine Clearance: 16.6 mL/min (by C-G formula based on SCr of 4.14 mg/dL (H)).    Allergies  Allergen Reactions  . Sulfasalazine Rash    Antimicrobials this admission: 2/24 Zosyn >>  2/24 vancomycin >>   Dose adjustments this admission: 2/25: Change Zosyn to 2.25 gr IV q6h due to worsening SCr   Microbiology results: 2/20 BCx: CoNS, diptheroids 2/23 MRSA PCR: negative 2/24: urine 2/24: BCx:  Thank you for allowing pharmacy to be a part of this patient's care.   Adalberto ColeNikola Storey Stangeland, PharmD, BCPS Pager [phone] 05/16/2016 7:29 AM

## 2016-05-16 NOTE — Assessment & Plan Note (Addendum)
Intermittently in and out of Afib  Plan Monitor  Hold off on amiodarone for now

## 2016-05-16 NOTE — Progress Notes (Signed)
eLink Physician-Brief Progress Note Patient Name: Howard RidgeJohn W Dixon DOB: 02-27-1946 MRN: [id number]   Date of Service  05/16/2016  HPI/Events of Note  Double triggering vent  eICU Interventions  Versed drip, have RT reset trigger ? Flow vs pressure better to prevent inappropriate cycling     Intervention Category Major Interventions: Respiratory failure - evaluation and management  Sandrea HughsMichael Joesiah Lonon 05/16/2016, 5:45 PM

## 2016-05-16 NOTE — Assessment & Plan Note (Addendum)
Severely elevated transaminases in setting of cardiogenic shock, somewhat better numbers today. He has acute shock liver, worsening with worsening renal failure.  Baseline alcoholic cirrhosis.  Prognosis poor.  Plan: Trend LFT's Hold any hepatotoxic medications

## 2016-05-16 NOTE — Assessment & Plan Note (Addendum)
Severe, worsening.  Likely due to hydrocortisone?  Titrate off hydrocortisone.  Plan: Continue Insulin gtt today Start long acting insulin on 2/27 after stopping hydrocortisone.

## 2016-05-16 NOTE — Assessment & Plan Note (Addendum)
Continue stress ulcer prophylaxis with pantoprazole

## 2016-05-17 ENCOUNTER — Inpatient Hospital Stay (HOSPITAL_COMMUNITY): Payer: Non-veteran care

## 2016-05-17 LAB — CBC WITH DIFFERENTIAL/PLATELET
BASOS ABS: 0 10*3/uL (ref 0.0–0.1)
Basophils Relative: 0 %
EOS ABS: 0 10*3/uL (ref 0.0–0.7)
EOS PCT: 0 %
HCT: 35.3 % — ABNORMAL LOW (ref 39.0–52.0)
Hemoglobin: 12.1 g/dL — ABNORMAL LOW (ref 13.0–17.0)
Lymphocytes Relative: 6 %
Lymphs Abs: 0.7 10*3/uL (ref 0.7–4.0)
MCH: 32.4 pg (ref 26.0–34.0)
MCHC: 34.3 g/dL (ref 30.0–36.0)
MCV: 94.4 fL (ref 78.0–100.0)
MONO ABS: 0.8 10*3/uL (ref 0.1–1.0)
Monocytes Relative: 6 %
Neutro Abs: 11.4 10*3/uL — ABNORMAL HIGH (ref 1.7–7.7)
Neutrophils Relative %: 88 %
PLATELETS: 135 10*3/uL — AB (ref 150–400)
RBC: 3.74 MIL/uL — AB (ref 4.22–5.81)
RDW: 15.1 % (ref 11.5–15.5)
WBC: 12.9 10*3/uL — AB (ref 4.0–10.5)

## 2016-05-17 LAB — CULTURE, RESPIRATORY W GRAM STAIN

## 2016-05-17 LAB — GLUCOSE, CAPILLARY
GLUCOSE-CAPILLARY: 206 mg/dL — AB (ref 65–99)
GLUCOSE-CAPILLARY: 240 mg/dL — AB (ref 65–99)
GLUCOSE-CAPILLARY: 258 mg/dL — AB (ref 65–99)
GLUCOSE-CAPILLARY: 263 mg/dL — AB (ref 65–99)
GLUCOSE-CAPILLARY: 288 mg/dL — AB (ref 65–99)
GLUCOSE-CAPILLARY: 291 mg/dL — AB (ref 65–99)
GLUCOSE-CAPILLARY: 296 mg/dL — AB (ref 65–99)
GLUCOSE-CAPILLARY: 309 mg/dL — AB (ref 65–99)
Glucose-Capillary: 263 mg/dL — ABNORMAL HIGH (ref 65–99)
Glucose-Capillary: 299 mg/dL — ABNORMAL HIGH (ref 65–99)
Glucose-Capillary: 288 mg/dL — ABNORMAL HIGH (ref 65–99)
Glucose-Capillary: 288 mg/dL — ABNORMAL HIGH (ref 65–99)
Glucose-Capillary: 296 mg/dL — ABNORMAL HIGH (ref 65–99)
Glucose-Capillary: 330 mg/dL — ABNORMAL HIGH (ref 65–99)
Glucose-Capillary: 332 mg/dL — ABNORMAL HIGH (ref 65–99)
Glucose-Capillary: 271 mg/dL — ABNORMAL HIGH (ref 65–99)
Glucose-Capillary: 291 mg/dL — ABNORMAL HIGH (ref 65–99)
Glucose-Capillary: 263 mg/dL — ABNORMAL HIGH (ref 65–99)
Glucose-Capillary: 279 mg/dL — ABNORMAL HIGH (ref 65–99)
Glucose-Capillary: 245 mg/dL — ABNORMAL HIGH (ref 65–99)

## 2016-05-17 LAB — COMPREHENSIVE METABOLIC PANEL
ALBUMIN: 3 g/dL — AB (ref 3.5–5.0)
ALK PHOS: 72 U/L (ref 38–126)
ALT: 3560 U/L — AB (ref 17–63)
AST: 1948 U/L — ABNORMAL HIGH (ref 15–41)
Anion gap: 14 (ref 5–15)
BUN: 98 mg/dL — ABNORMAL HIGH (ref 6–20)
CALCIUM: 7.7 mg/dL — AB (ref 8.9–10.3)
CO2: 20 mmol/L — AB (ref 22–32)
CREATININE: 5.49 mg/dL — AB (ref 0.61–1.24)
Chloride: 91 mmol/L — ABNORMAL LOW (ref 101–111)
GFR calc Af Amer: 11 mL/min — ABNORMAL LOW (ref >60)
GFR calc non Af Amer: 10 mL/min — ABNORMAL LOW (ref >60)
GLUCOSE: 302 mg/dL — AB (ref 65–99)
Potassium: 5.1 mmol/L (ref 3.5–5.1)
SODIUM: 125 mmol/L — AB (ref 135–145)
Total Bilirubin: 7.1 mg/dL — ABNORMAL HIGH (ref 0.3–1.2)
Total Protein: 6 g/dL — ABNORMAL LOW (ref 6.5–8.1)

## 2016-05-17 LAB — PHOSPHORUS: PHOSPHORUS: 6.1 mg/dL — AB (ref 2.5–4.6)

## 2016-05-17 LAB — MAGNESIUM: Magnesium: 1.9 mg/dL (ref 1.7–2.4)

## 2016-05-17 LAB — PROTIME-INR
INR: 3.49
Prothrombin Time: 35.8 seconds — ABNORMAL HIGH (ref 11.4–15.2)

## 2016-05-17 LAB — PROCALCITONIN: PROCALCITONIN: 16.8 ng/mL

## 2016-05-17 LAB — CULTURE, RESPIRATORY

## 2016-05-17 LAB — AMMONIA: AMMONIA: 54 umol/L — AB (ref 9–35)

## 2016-05-17 MED ORDER — MORPHINE BOLUS VIA INFUSION
5.0000 mg | INTRAVENOUS | Status: DC | PRN
  Filled 2016-05-17: qty 20

## 2016-05-17 MED ORDER — SODIUM CHLORIDE 0.9 % IV SOLN
10.0000 mg/h | INTRAVENOUS | Status: DC
  Filled 2016-05-17: qty 10

## 2016-05-18 LAB — BLOOD GAS, ARTERIAL
ACID-BASE DEFICIT: 5 mmol/L — AB (ref 0.0–2.0)
Acid-base deficit: 7.4 mmol/L — ABNORMAL HIGH (ref 0.0–2.0)
BICARBONATE: 18.8 mmol/L — AB (ref 20.0–28.0)
Bicarbonate: 14.2 mmol/L — ABNORMAL LOW (ref 20.0–28.0)
Drawn by: 448981
FIO2: 1
FIO2: 30
O2 SAT: 97.1 %
PATIENT TEMPERATURE: 98.6
PEEP/CPAP: 5 cmH2O
Patient temperature: 102
RATE: 14 resp/min
VT: 510 mL
pCO2 arterial: 20.1 mmHg — ABNORMAL LOW (ref 32.0–48.0)
pCO2 arterial: 35.7 mmHg (ref 32.0–48.0)
pH, Arterial: 7.463 — ABNORMAL HIGH (ref 7.350–7.450)
pH, Arterial: 7.352 (ref 7.350–7.450)
pO2, Arterial: 531 mmHg — ABNORMAL HIGH (ref 83.0–108.0)
pO2, Arterial: 113 mmHg — ABNORMAL HIGH (ref 83.0–108.0)

## 2016-05-18 NOTE — Progress Notes (Signed)
Wasted 175 mL of fentanyl and 40 mL of versed down sink.  Alex RN witnessed the wasting of these medications at [phone]

## 2016-05-20 LAB — CULTURE, BLOOD (ROUTINE X 2)
CULTURE: NO GROWTH
Culture: NO GROWTH

## 2016-05-20 NOTE — Assessment & Plan Note (Signed)
Continue bronchodilators as prescribed (atrovent/xopenx tid and prn)

## 2016-05-20 NOTE — Assessment & Plan Note (Signed)
Per cardiology 

## 2016-05-20 NOTE — Progress Notes (Signed)
Patient family (wife, 2 daughters, and grandchildren) present at bedside and they would like to proceed with withdrawal of care.  Patient wife, Howard Dixon states "we do not want him to suffer anymore and we would like to take him off life support." PCCM notified of family desires, orders written for comfort care measures. Chaplain paged per family request.

## 2016-05-20 NOTE — Progress Notes (Signed)
  Interdisciplinary Goals of Care Family Meeting   Date carried out:: 2016-09-15  Location of the meeting: Bedside  Member's involved: Nurse Practitioner, Bedside Registered Nurse and Family Member or next of kin  Durable Power of Attorney or acting medical decision maker:  Wife, Daughter and Lucila MaineGrandson present   Discussion: We discussed goals of care for Howard Dixon .  We reviewed his prior medical history at bedside to include smoking, COPD, chronic systolic CHF, GERD etc and current clinical status to include multisystem organ failure on maximum support. Despite current level of care, he has continued to decline.  Reviewed the concept of CPR and comfort measures.  Family is surprised at the severity of his illness.  They understand he his prognosis is poor and are in agreement for DNR in the event of arrest.  At this time, they would like to continue current level of care to allow time for discussion with the remainder of the family.    Code status: Full DNR  Disposition: Continue current acute care for now.  Will re-visit with family 2/27 for possible withdrawal of care.    Time spent for the meeting: 30 minutes   Canary BrimBrandi Lidie Glade, NP-C [facility] Pgr: [phone] or if no answer [phone] 2016-09-15, 2:09 PM

## 2016-05-20 NOTE — Progress Notes (Signed)
Subjective:  Events of weekend noted. Patient remains intubated and sedated on inotropes.   Objective:  Vital Signs in the last 24 hours: Temp:  [97.7 F (36.5 C)-100.9 F (38.3 C)] 100.9 F (38.3 C) (02/26 0730) Pulse Rate:  [49-117] 116 (02/26 1100) Resp:  [10-22] 14 (02/26 1100) BP: (84-109)/(50-69) 93/64 (02/26 1100) SpO2:  [93 %-100 %] 100 % (02/26 1111) FiO2 (%):  [30 %] 30 % (02/26 1111) Weight:  [182 lb 15.7 oz (83 kg)] 182 lb 15.7 oz (83 kg) (02/26 0500)  Intake/Output from previous day: 02/25 0701 - 02/26 0700 In: 2903.9 [I.V.:1198.6; NG/GT:1205.3; IV Piggyback:500] Out: 256 [Urine:256] Intake/Output from this shift: Total I/O In: 540.9 [I.V.:280.9; NG/GT:260] Out: 35 [Urine:35]  Physical Exam: Neck: no adenopathy, no carotid bruit, no JVD and supple, symmetrical, trachea midline Lungs: Decreased breath sound at bases with occasional rhonchi Heart: Tachycardic S1-S2 soft Abdomen: Soft bowel sounds diminished   Lab Results:  Recent Labs  05/16/16 0544 04/25/2016 0150  WBC 9.9 12.9*  HGB 12.4* 12.1*  PLT 115* 135*    Recent Labs  05/16/16 1426 05/12/2016 0445  NA 123* 125*  K 4.7 5.1  CL 88* 91*  CO2 20* 20*  GLUCOSE 327* 302*  BUN 86* 98*  CREATININE 4.61* 5.49*    Recent Labs  05/14/16 2219 05/15/16 0222  TROPONINI 0.29* 0.48*   Hepatic Function Panel  Recent Labs  05/16/16 0544 05/18/2016 0445  PROT 6.4* 6.0*  ALBUMIN 3.1* 3.0*  AST 5,043* 1,948*  ALT 4,438* 3,560*  ALKPHOS 77 72  BILITOT 6.3* 7.1*  BILIDIR 3.5*  --   IBILI 2.8*  --    No results for input(s): CHOL in the last 72 hours. No results for input(s): PROTIME in the last 72 hours.  Imaging: Imaging results have been reviewed and Dg Chest Port 1 View  Result Date: 04/24/2016 CLINICAL DATA:  Follow-up pneumonia and endotracheal tube positioning. EXAM: PORTABLE CHEST 1 VIEW COMPARISON:  Chest radiograph performed 05/16/2016 FINDINGS: The patient's endotracheal tube is seen  ending 5-6 cm above the carina. A right IJ line is noted ending about the mid SVC. There has been interval redistribution of the patient's right midlung zone pneumonia, and mild left basilar opacity is also seen. Mild vascular congestion is noted. No pleural effusion or pneumothorax is seen, though the right costophrenic angle is incompletely imaged on this study. The cardiomediastinal silhouette is mildly enlarged. No acute osseous abnormalities are seen. IMPRESSION: 1. Endotracheal tube seen ending 5-6 cm above the carina. 2. Interval redistribution of right-sided pneumonia. Mild left basilar opacity also now seen. 3. Mild vascular congestion and mild cardiomegaly. Electronically Signed   By: Roanna RaiderJeffery  Chang M.D.   On: 05/02/2016 04:20   Dg Chest Port 1 View  Result Date: 05/16/2016 CLINICAL DATA:  Acute respiratory failure with hypoxia. COPD. Acute kidney injury. EXAM: PORTABLE CHEST 1 VIEW COMPARISON:  05/15/2016 FINDINGS: Support lines and tubes in appropriate position. Stable mild cardiomegaly. Stable mild diffuse interstitial infiltrates consistent with mild interstitial edema. There is increased airspace disease in the right infrahilar region, suspicious for right lower lobe pneumonia. No evidence of pneumothorax or pleural effusion. IMPRESSION: Increased right infrahilar airspace disease, suspicious for right lower lobe pneumonia. Stable mild diffuse interstitial infiltrates, consistent with mild interstitial edema. Electronically Signed   By: Myles RosenthalJohn  Stahl M.D.   On: 05/16/2016 07:39   [place] Abdomen Limited Ruq  Result Date: 05/16/2016 CLINICAL DATA:  Acute cholecystitis EXAM: US ABDOMEN LIMITED - RIGHT UPPER QUADRANT  COMPARISON:  None. FINDINGS: Gallbladder: Gallbladder wall is thickened measuring 3.5 mm. Gallbladder sludge noted. Trace perihepatic and pericholecystic fluid. Common bile duct: Diameter: 6 mm Liver: No focal lesion identified. Within normal limits in parenchymal echogenicity. IMPRESSION:  1. There is gallbladder wall thickening and gallbladder sludge. Trace perihepatic fluid. Cannot rule out cholecystitis. 2. Common bile duct is upper limits of normal in caliber measuring 6 mm. Electronically Signed   By: Signa Kell M.D.   On: 05/16/2016 15:06    Cardiac Studies:  Assessment/Plan:  Hypotensive shock multifactorial i.e. cardiogenic/sepsis Status post a flutter with 2 to one block, converted to sinus rhythm. Status post nonsustained VT Acute respiratory failure  Right lung A aspiration pneumonia COPD Coronary artery disease, history of MI in the past. Multivessel CAD. Aortic stenosis Ischemic cardiomyopathy. Hypertension. Diabetes mellitus. Tobacco abuse. Thoracic aneurysm. Acute on chronic Chronic kidney disease secondary to cardiorenal syndrome or  Elevated LFTs secondary to shock liver  Multisystem organ failure  Plan  Continue present management per CCM  Patient not a candidate for any cardiac interventions at this point  LOS: 6 days    Rinaldo Cloud 05/12/2016, 11:27 AM

## 2016-05-20 NOTE — Progress Notes (Signed)
Called by RN regarding prior discussion with family.  They indicate they are ready to proceed with withdrawal of care.  The patient is comfortable on fentanyl / versed gtts at present.  Will utilize these for withdrawal process.    Plan: DNR/DNI  Full comfort care  Wean vasopressors to off  Continue fentanyl for pain, versed for anxiety Orders in place for RN, discussed plan of care with bedside nurse.    Canary BrimBrandi Tannon Peerson, NP-C [facility] Pgr: [phone] or if no answer [phone] 05/16/2016, 4:26 PM

## 2016-05-20 NOTE — Progress Notes (Signed)
Pt's wife and other family members were in the hall when I arrived to the unit. His granddaughter, another daughter and companion were bedside. Pt's wife wanted to remain outside of unit with family members and asked for prayer. They mentioned a granddaughter who was bedside and did want to leave. I met granddaughter and her mother bedside. Granddaughter, Howard Dixon was weeping and repeatedly said she needed him. I offered emotional and spiritual support to [facility]. We talked about how much her grandfather meant to her and how a part of him will always remain with her. She drew strength and comfort from that. Her memory of his wisdom was a big part of her ability to regain composure and courage to leave her departed. Family was very appreciative of [place] presence, prayer and emotional support. Chaplain Ernest Haber, M.Div.   22-May-2016 2300  Clinical Encounter Type  Visited With Family

## 2016-05-20 NOTE — Discharge Summary (Signed)
[facility] PCCM Death Note  Date of admission: 05/07/2016  Date of Death 01-27-17  Cause of death 1) Systolic congestive heart failure 2) COPD 3) Acute kidney injury 4) Alcohol abuse 5) Acute liver failure 6) alcoholic hepatitis 7) hepatorenal syndrome  Brief HPI and hospital course 71 y/o male with known COPD, systolic heart failure and long standing EtOH use admitted on 2/20 with dyspnea, vomiting.  He was noted to have markedly elevated transaminases on admission as well as A-rib with RVR and worsening AKI. His condition progressed and he ultimately required intubation for acute respiratory failure with hypoxemia. He was managed in the ICU with inotrope vasopressor therapy to help improve his kidney function as well as mechanical ventilatory support in the setting of respiratory faiulre.  He struggled with atrial fibrillation during his ICU stay as well.  Despite our best efforts his renal function continued to decline.  Because of his multiple comorbid illnesses and poor overall health we discussed his code status with his family and worsening multi-organ failure. They elected to change his code status to DNR and not proceed with hemodialysis.  They requested withdrawal of care. He died on 2/27.    Heber CarolinaBrent Deundra Bard, MD [place] [facility]M Pager: [phone] Cell: [phone] After 3pm or if no response, call [phone]

## 2016-05-20 NOTE — Progress Notes (Signed)
Date:  May 17, 2016 Chart reviewed for concurrent status and case management needs. Will continue to follow patient progress. Discharge Planning: following for needs Expected discharge date: [id number] Antwaun Buth, BSN, RN3, CCM   [phone] 

## 2016-05-20 NOTE — Progress Notes (Signed)
OT Cancellation Note  Patient Details Name: Howard RidgeJohn W Dixon MRN: [id number] DOB: [birth year]   Cancelled Treatment:    Reason Eval/Treat Not Completed: Other (comment) OT  deferred this date.  Pt on vent.  Will follow  Donya Tomaro, [place] KungLorraine D 08/12/2016, 9:31 AM

## 2016-05-20 NOTE — Progress Notes (Signed)
Pt had a 14-beat run of v-tach. Asymptomatic. Notified Elink.

## 2016-05-20 NOTE — Procedures (Signed)
Extubation Procedure Note  Patient Details:   Name: Howard Dixon DOB: July 09, 1945 MRN: [id number]   Airway Documentation:     Evaluation  O2 sats: currently acceptable Complications: No apparent complications Patient did tolerate procedure well. Bilateral Breath Sounds: Clear, Rhonchi   No, Pt terminally weaned per order  Toula MoosCampbell, Namari Breton Faulkner 05/07/2016, 5:29 PM

## 2016-05-20 NOTE — Progress Notes (Signed)
Attending [facility] PCCM Note  Name: Howard Dixon MRN:   [id number] DOB:   07-05-1945           ADMISSION DATE:  05/14/2016 CONSULTATION DATE:  05/14/16  Brief summary: 71 y/o male with known COPD, systolic heart failure and long standing EtOH use admitted on 2/20 with dyspnea, vomiting.  He was noted to have markedly elevated transaminases on admission as well as A-rib with RVR and worsening AKI. His condition progressed and he ultimately required intubation for acute respiratory failure with hypoxemia.  STUDIES:  2/20  CT ABD/Pelvis >> enlarged L lobe of liver concerning for early cirrhosis, gallbladder sludge vs small stones, 7 mm non-obstructing R renal stone without hydronephrosis, small ascites, sigmoid diverticulosis, no bowel obstruction, small R effusion 2/20 LE Duplex >> negative for DVT 2/21 ECHO >> LV mildly dilated, LVEF 20%, diffuse hypokinesis, no LV thrombus, grade II diastolic dysfunction, moderate AS, PA Peak 56 2/25 RUQ ultrasound> gallbladder wall thickening and gallbladder sludge, trace perihepatic fluid, cannot rule out cholecysitis, CBD normal caliber   CULTURES: BCx2 2/20 >> 1/2 with coag neg staph 2/24 resp culture >  2/24 blood culture >  2/25 resp viral panel > negative  ANTIBIOTICS: 2/24 Vanc >  2/24 Zosyn >   LINES/TUBES: ETT 2/23 >> R IJ TLC 2/23 >>  SIGNIFICANT EVENTS: 2/20  Admit with sCHF  2/21 - echo ef 20% 2/23  PCCM consulted for hypotension, AMS, near resp arrest 2/24 - intubated. On levophed gtt, amio gtt, heparing tt, fent gtt. RASS -2. Able to track. RN reports high sugars 2/25 - febrile,. Levo at 9. Creat up at 4. Sinus tach - still on amio gtt and IV heparin gtt. 30% fio2 . Needing cooling blanket. Not on insulin gtt. No family at bedside. Worsening jaundice  SUBJECTIVE/OVERNIGHT/INTERVAL HX:  Vitals: Vitals:   05/29/16 0930 May 29, 2016 0945 05-29-2016 1000 05-29-2016 1015  BP: (!) 96/59 (!) 100/59 92/61 (!) 94/59  Pulse: (!) 115 (!) 116  (!) 115 (!) 113  Resp: 13 13 13 15   Temp:      TempSrc:      SpO2: 100% 100% 100% 100%  Weight:      Height:       Vent settings: Vent Mode: PRVC FiO2 (%):  [30 %] 30 % Set Rate:  [14 bmp] 14 bmp Vt Set:  [510 mL-600 mL] 600 mL PEEP:  [5 cmH20] 5 cmH20 Plateau Pressure:  [10 cmH20-15 cmH20] 15 cmH20    Intake/Output Summary (Last 24 hours) at 2016-05-29 1054 Last data filed at 2016-05-29 1000  Gross per 24 hour  Intake          3165.78 ml  Output              286 ml  Net          2879.78 ml     Physical exam:   GENERAL: chronically ill appearing HENT: NCAT, ETT in place Eyes: scleral icterus PULM: Rhonchi bilaterally, vent supported breathing CV: tachy, irregular, no mgr GI: infrequent bowel sounds, no masses DERM: some edema, no rash NEURO: stirs to touch, heavily sedated  Labs:  CBC    Component Value Date/Time   WBC 12.9 (H) 05-29-16 0150   RBC 3.74 (L) 05-29-16 0150   HGB 12.1 (L) 05-29-16 0150   HCT 35.3 (L) 2016/05/29 0150   PLT 135 (L) 05-29-16 0150   MCV 94.4 29-May-2016 0150   MCH 32.4 May 29, 2016 0150   MCHC 34.3 May 29, 2016 0150  RDW 15.1 06/02/16 0150   LYMPHSABS 0.7 06/02/16 0150   MONOABS 0.8 Jun 02, 2016 0150   EOSABS 0.0 02-Jun-2016 0150   BASOSABS 0.0 02-Jun-2016 0150    BMET    Component Value Date/Time   NA 125 (L) 06-02-2016 0445   K 5.1 02-Jun-2016 0445   CL 91 (L) June 02, 2016 0445   CO2 20 (L) 06/02/16 0445   GLUCOSE 302 (H) June 02, 2016 0445   BUN 98 (H) 06/02/16 0445   CREATININE 5.49 (H) 06/02/16 0445   CALCIUM 7.7 (L) 2016/06/02 0445   GFRNONAA 10 (L) 06/02/16 0445   GFRAA 11 (L) 06-02-2016 0445    Hepatic Function Latest Ref Rng & Units Jun 02, 2016 05/16/2016 05/14/2016  Total Protein 6.5 - 8.1 g/dL 6.0(L) 6.4(L) 7.2  Albumin 3.5 - 5.0 g/dL 3.0(L) 3.1(L) 3.8  AST 15 - 41 U/L 1,948(H) 5,043(H) 432(H)  ALT 17 - 63 U/L 3,560(H) 4,438(H) 1,060(H)  Alk Phosphatase 38 - 126 U/L 72 77 66  Total Bilirubin 0.3 - 1.2  mg/dL 7.1(H) 6.3(H) 3.9(H)  Bilirubin, Direct 0.1 - 0.5 mg/dL - 3.5(H) -     ASSESSMENT AND PLAN:    Acute hepatitis Severely elevated transaminases in setting of cardiogenic shock, somewhat better numbers today. He has acute shock liver, worsening with worsening renal failure.  Baseline alcoholic cirrhosis.  Prognosis poor.  Plan: Trend LFT's Hold any hepatotoxic medications  Acute on chronic systolic CHF (congestive heart failure) (HCC) Severe cardiogenic shock, systolic heart failure.  Not improved, prognosis grim.  Plan: Check [place] CVP monitoring Continue levophed titrated to Maintain MAP > 65 Continue vasopressin for MAP > 65  Acute renal failure superimposed on stage 3 chronic kidney disease (HCC) Worsening.  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 Prognosis dismal.  CVVHD would not help, only manage electrolytes, fluids, etc.  He is a terrible candidate for long term dialysis.  Plan Will discuss goals of care with daughter today  Acute respiratory failure with hypoxia ([place]) with underlying COPD and tobacco use Multi-factorial process with air hunger. COPD, CHF and HCAP (RLL and RUL infiltrate seen on my personal review of the 2/25 CXR). Has triggering issues with vent.  Plan Full vent support Increase PEEP to 9 for better triggering Change PRVC to Pressure control for comfort Titrate FiO2 to maintain O2 saturation > 90%   Atrial fibrillation, new onset (HCC) Intermittently in and out of Afib  Plan Monitor  Hold off on amiodarone for now Per Cardiology   Type 2 diabetes, controlled, with neuropathy (HCC) Severe, worsening.  Likely due to hydrocortisone?  Titrate off hydrocortisone.  Plan: Continue Insulin gtt today Start long acting insulin on 2/27 after stopping hydrocortisone.  [place] Agitation Sedation Scale (RASS) score  in ICU Still with significant vent dyssynchrony.    Plan: Continue fentanyl infusion and  versed infusion titrated to RASS -3 Daily WUA  GERD (gastroesophageal reflux disease) Continue stress ulcer prophylaxis with pantoprazole  COPD (chronic obstructive pulmonary disease) (HCC) Continue bronchodilators as prescribed (atrovent/xopenx tid and prn)  Feeding: continue tube feeding Analgesia: fentanyl infusion Sedation: versed infusion Thromboprophylaxis: lovenox HOB >30 degrees Ulcer prophylaxis: pantoprazole Glucose control: insulin gtt   My cc time 40 minutes  Roselie Awkward, MD [place] P[facility]M Pager: [phone] Cell: [phone] After 3pm or if no response, call [phone]

## 2016-05-20 NOTE — Progress Notes (Signed)
Chaplain responded to page to the room for the family who has chosen to proceed with taking their loved one off of life support.  Chaplain met wife, step daughters and grandchildren.  Family is grieving and having appropriate crying about the possible death of their loved one.  Chaplain provided space for family to reflect on memories of the loved one.  Family asked for prayer.  Chaplain prayed with the family and stayed around to support the family spiritually and emotionally.  Family seems to be coping well.  On call Chaplain will be called should the patient pass away once the life support has been removed.  Dorna Bloom Resident

## 2016-05-20 NOTE — Progress Notes (Signed)
PT Cancellation Note  Patient Details Name: Howard RidgeJohn W Dixon MRN: [id number] DOB: 11-30-1945   Cancelled Treatment:     PT deferred this date.  Pt on vent.  Will follow.   Shaylah Mcghie 08-19-2016, 8:36 AM

## 2016-05-20 NOTE — Progress Notes (Signed)
eLink Physician-Brief Progress Note Patient Name: Howard RidgeJohn W Dixon DOB: 02/24/1946 MRN: [id number]   Date of Service  05/03/2016  HPI/Events of Note  I reviewed family mtg note from earlier today by B Ollis. Based on current GOC I will change code status to DNR, plan continue all other medical and current aggressive care./   eICU Interventions       Intervention Category Intermediate Interventions: Other:  Howard Dixon S. 05/06/2016, 3:45 PM

## 2016-05-20 NOTE — Progress Notes (Signed)
Pharmacy Antibiotic Note  Howard Dixon is a 71 y.o. male admitted on Feb 13, 2017 with sepsis.  Pharmacy has been consulted for vancomycin/Zosyn dosing.  Pt with acute respiratory failure with hypoxia with spiking fevers.  2/26 pt renal function continues to worsen, SCr now up 5.49.  Plan:  Continue Zosyn 2.25g IV q6h due to CrCl less than 20 ml/min  Hold vancomycin, check level about 24 hours after most recent dose to determine clearance (check in AM)  Monitor clinical course, renal function, cultures as available   Height: 5\' 6"  (167.6 cm) Weight: 182 lb 15.7 oz (83 kg) IBW/kg (Calculated) : 63.8  Temp (24hrs), Avg:99.4 F (37.4 C), Min:97.7 F (36.5 C), Max:100.9 F (38.3 C)   Recent Labs Lab 12/26/2016 0045  05/14/16 1007 05/15/16 0222 05/15/16 0838 05/16/16 0544 05/16/16 0807 05/16/16 1425 05/16/16 1426 05/16/16 1456 04/27/2016 0150 05/05/2016 0445  WBC 12.3*  --  10.9* 17.1*  --  9.9  --   --   --   --  12.9*  --   CREATININE 2.31*  < >  --   --  2.69* 4.14* 4.46*  --  4.61*  --   --  5.49*  LATICACIDVEN  --   --   --   --   --   --  2.5* 2.2*  --   --   --   --   VANCORANDOM  --   --   --   --   --   --   --   --   --  12  --   --   < > = values in this interval not displayed.  Estimated Creatinine Clearance: 12.8 mL/min (by C-G formula based on SCr of 5.49 mg/dL (H)).    Allergies  Allergen Reactions  . Sulfasalazine Rash   Antimicrobials this admission:  2/24 Zosyn >>  2/24 vancomycin >>   Dose adjustments this admission:  2/25 1300 vanc random level = 12 mcg/ml (~24hr after 1250mg  dose) - start 750mg  q12h 2/27 0500 vanc random level = ___ mcg/ml (~24hr after most recent 750mg  dose)  Microbiology results:  2/20 BCx: 1/2 CoNS, diptheroids 2/23 MRSA PCR: negative 2/24 Trach asp: reincubated, mod GPC, mod GPR, abundant GN coccobacillus 2/24 UCx: ng-final 2/24: BCx: ngtd 2/25 Resp panel: negative  Thank you for allowing pharmacy to be a part of this  patient's care.  Loralee PacasErin Lilinoe Acklin, PharmD, BCPS Pager: [phone] 05/13/2016, 11:06 AM

## 2016-05-20 DEATH — deceased

## 2016-05-24 ENCOUNTER — Telehealth: Payer: Self-pay

## 2016-05-24 NOTE — Telephone Encounter (Signed)
On 05/24/16 I received a death certificate from [employer] (original). The death certificate is for burial. The patient is a patient of Doctor McQuaid. The death certificate will be taken to Pulmonary Unit @ [facility] this pm for signature.  On 05/26/16 I received the death certificate back from Doctor McQuaid. I got the d/c ready and called the funeral home to let them know the d/c was mailed to vital records per their request.

## 2016-09-08 ENCOUNTER — Ambulatory Visit: Payer: Self-pay | Admitting: Internal Medicine

## 2018-06-07 IMAGING — US US ABDOMEN LIMITED
1 series · 14 of 25 positions shown · non-contrast
Comparison: None.

CLINICAL DATA: Acute cholecystitis

EXAM:
US ABDOMEN LIMITED - RIGHT UPPER QUADRANT

[Series 1: us abdomen limited · 0.22mm/px · 14 of 95 slices shown]
[im 1/95]
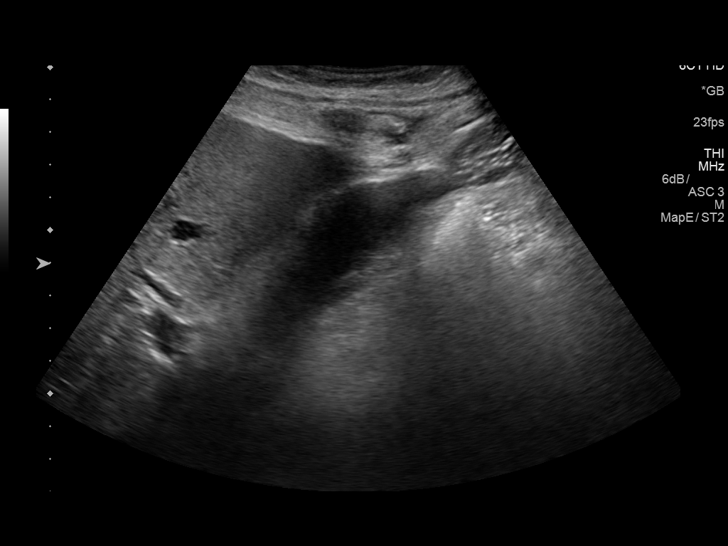
[im 8/95]
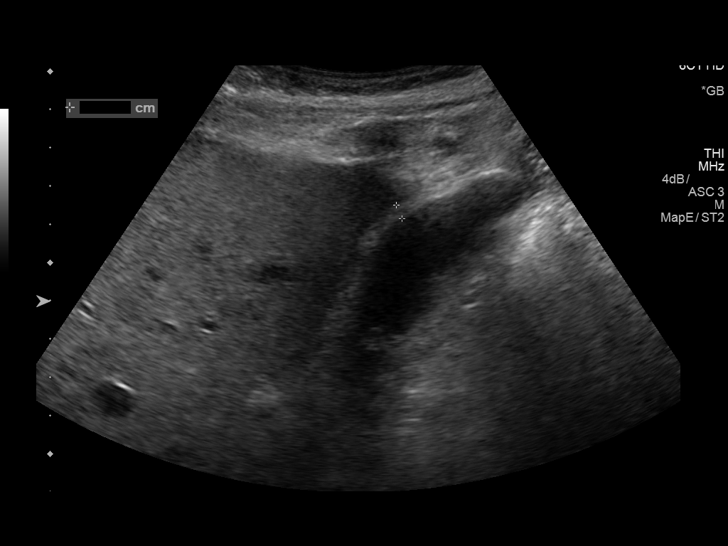
[im 16/95]
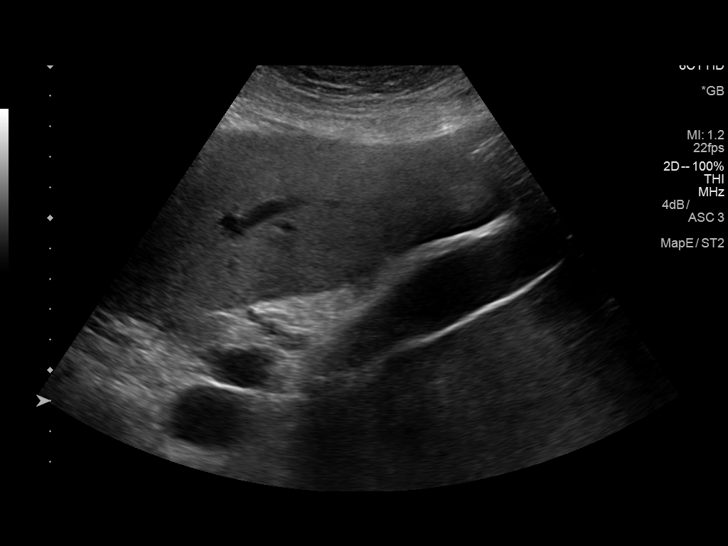
[im 24/95]
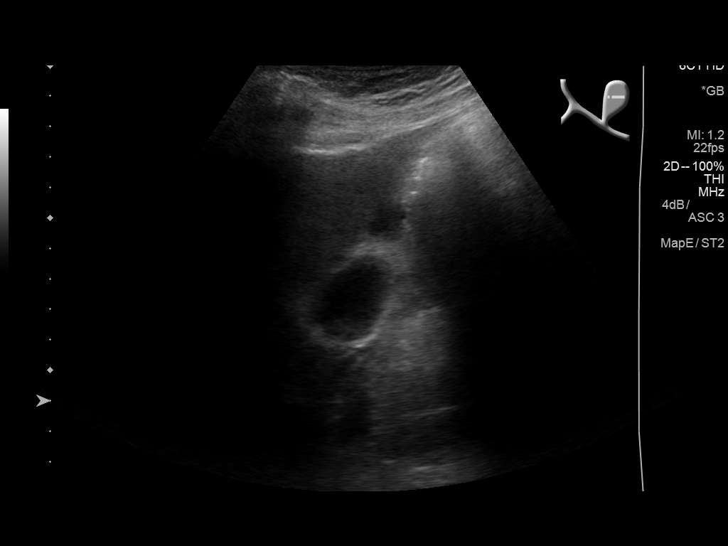
[im 32/95]
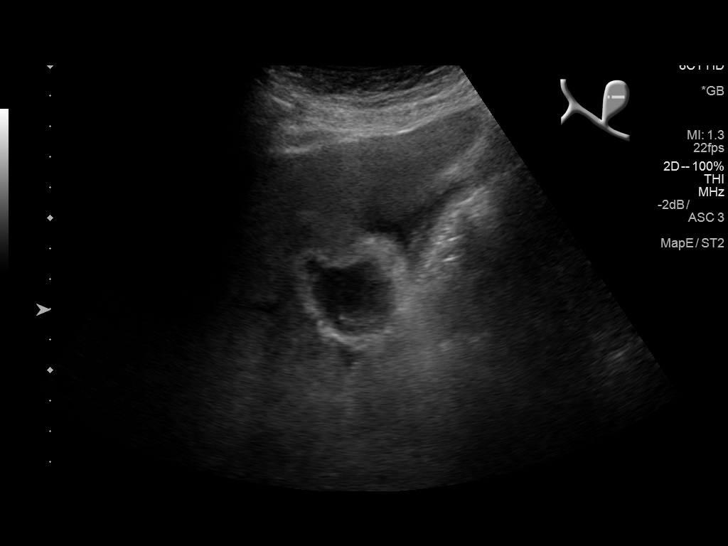
[im 36/95]
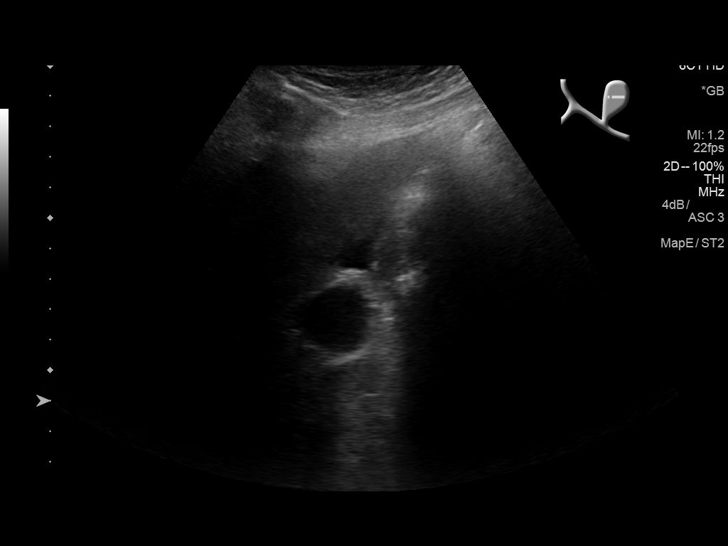
[im 44/95]
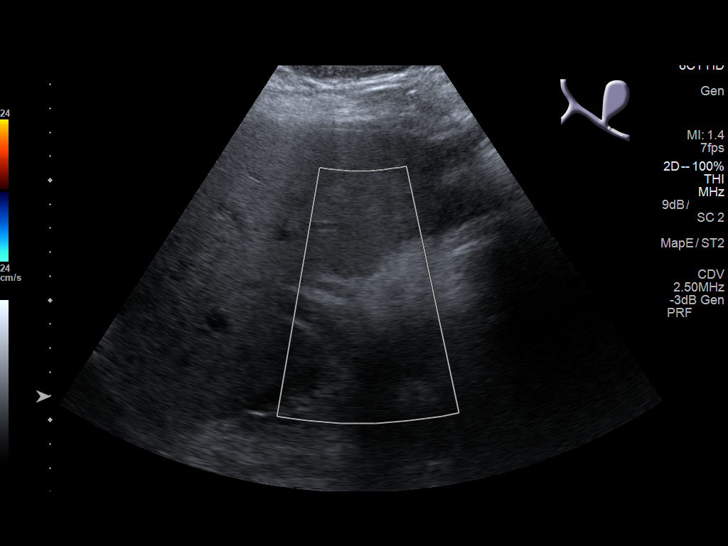
[im 51/95]
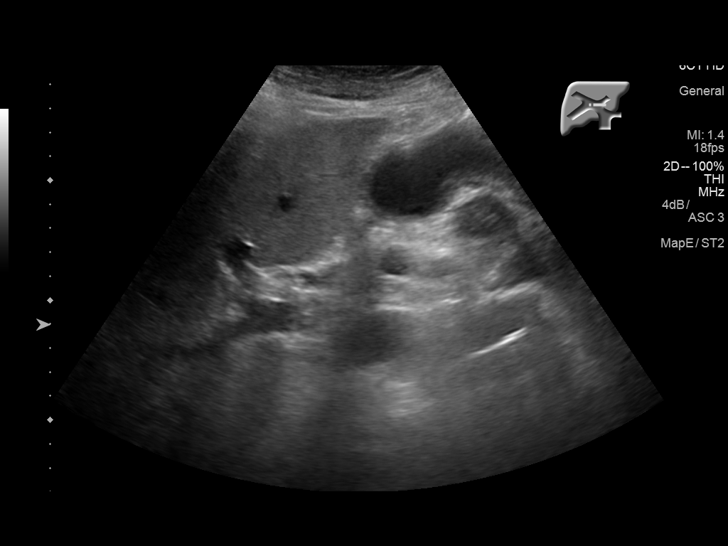
[im 59/95]
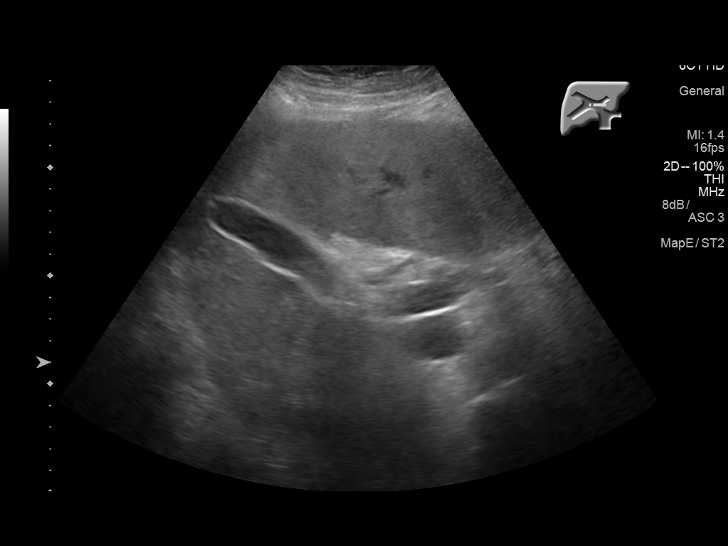
[im 63/95]
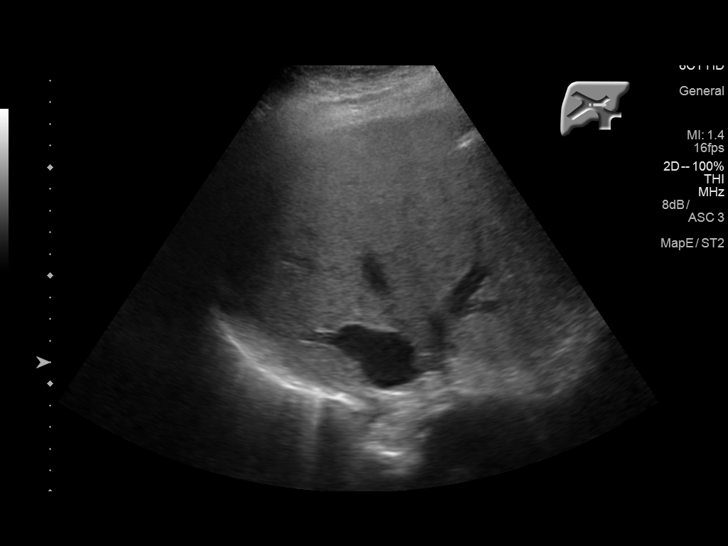
[im 71/95]
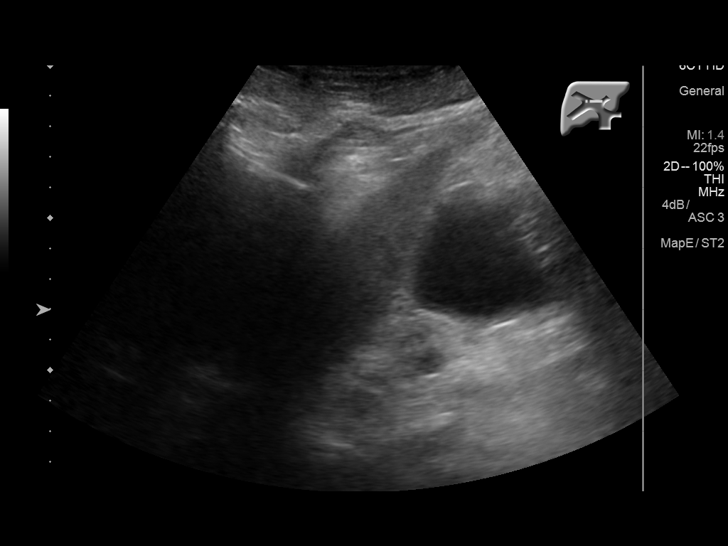
[im 79/95]
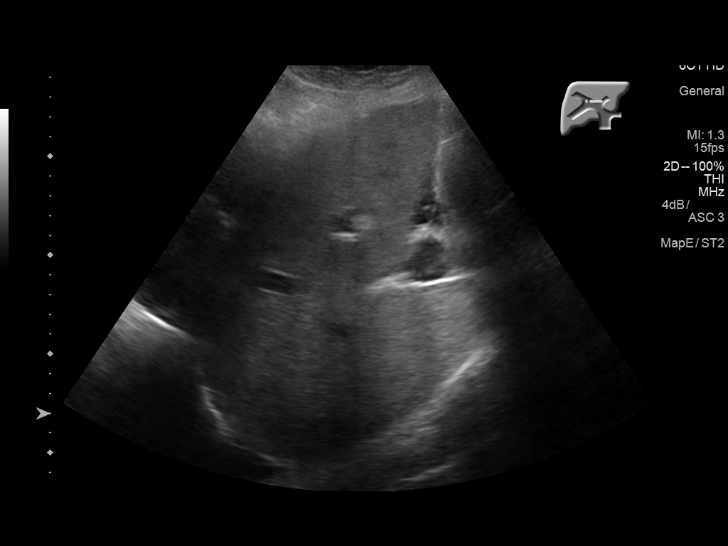
[im 87/95]
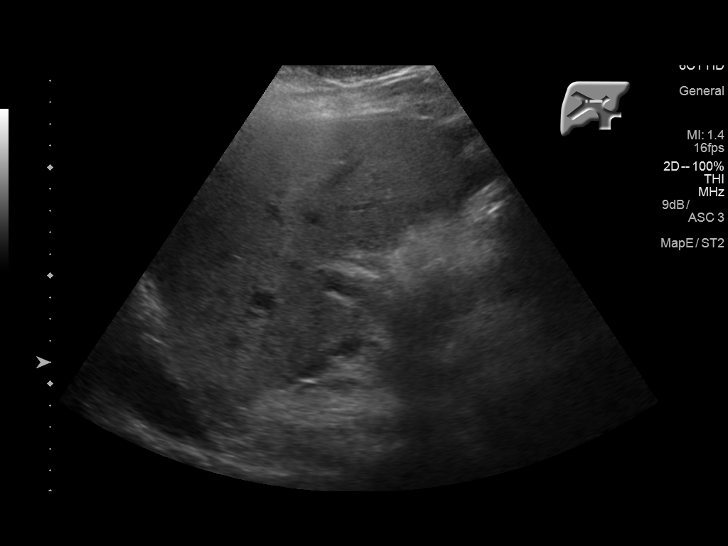
[im 95/95]
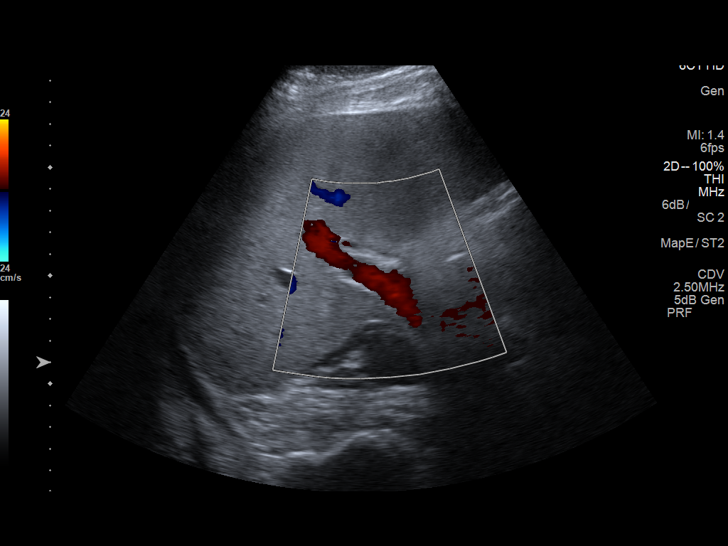

[14 of 25 positions shown; findings below may reference images not displayed]

FINDINGS: Gallbladder:

Gallbladder wall is thickened measuring 3.5 mm. Gallbladder sludge
noted. Trace perihepatic and pericholecystic fluid.

Common bile duct:

Diameter: 6 mm

Liver:

No focal lesion identified. Within normal limits in parenchymal
echogenicity.
IMPRESSION: 1. There is gallbladder wall thickening and gallbladder sludge.
Trace perihepatic fluid. Cannot rule [DATE]. Common bile duct is upper limits of normal in caliber measuring 6
mm.

## 2018-12-22 IMAGING — CT CT ABD-PELV W/O CM
2 of 4 series · 14 of 46 positions shown, 16 images · non-contrast
Comparison: Abdominal CT dated 07/28/2015

CLINICAL DATA: 70-year-old male with abdominal pain and
microhematuria. No bowel movements for several days.

EXAM:
CT ABDOMEN AND PELVIS WITHOUT CONTRAST
TECHNIQUE: Multidetector CT imaging of the abdomen and pelvis was performed
following the standard protocol without IV contrast.

[Series 2: abd/pel w/o · axial · non-contrast · 0.73mm/px · z∈[-346,+70]mm · 11 of 93 slices shown, 13 images]
[im 5/93  soft-tissue]
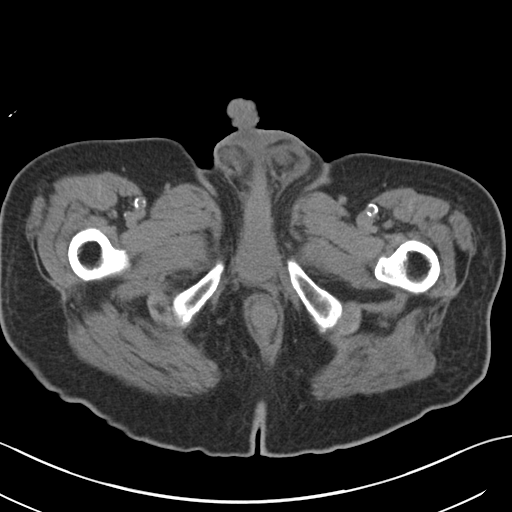
[im 5/93  bone]
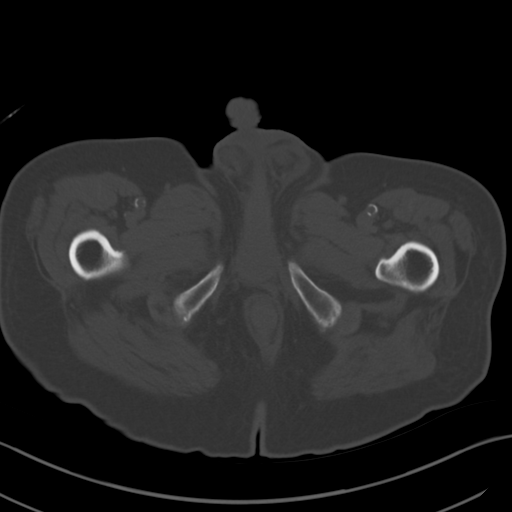
[im 14/93  soft-tissue]
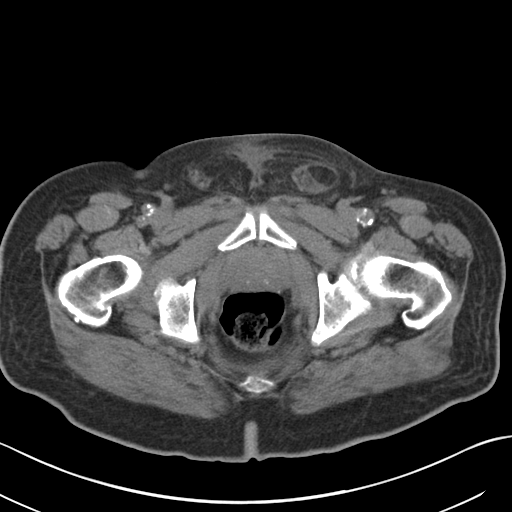
[im 22/93  soft-tissue]
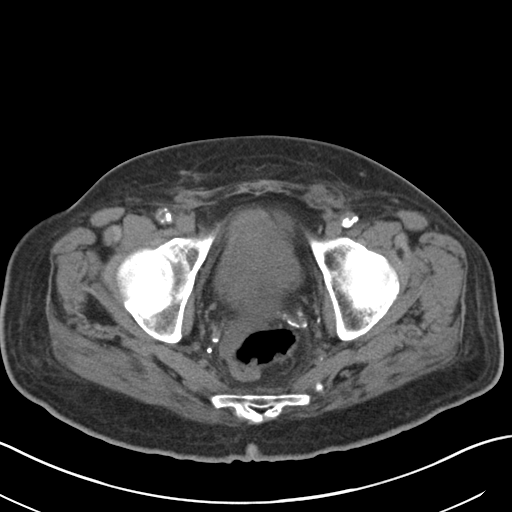
[im 31/93  soft-tissue]
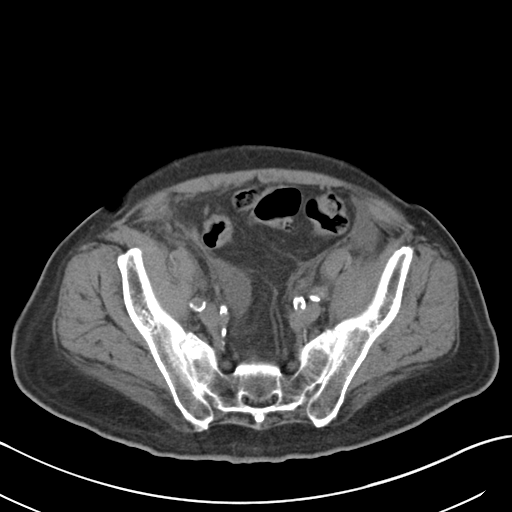
[im 40/93  soft-tissue]
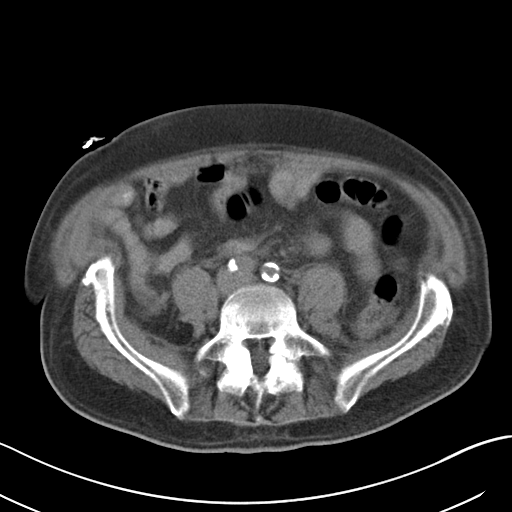
[im 49/93  soft-tissue]
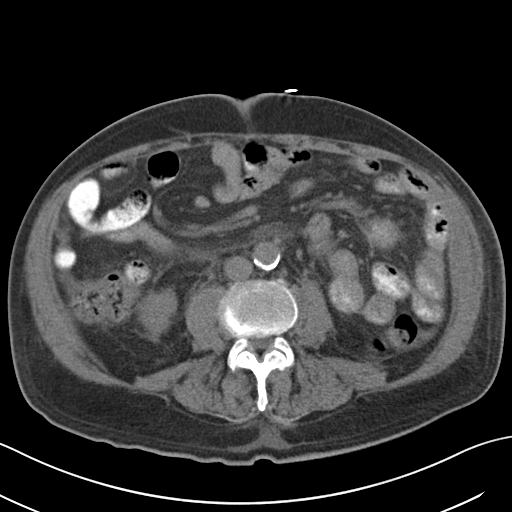
[im 53/93  soft-tissue]
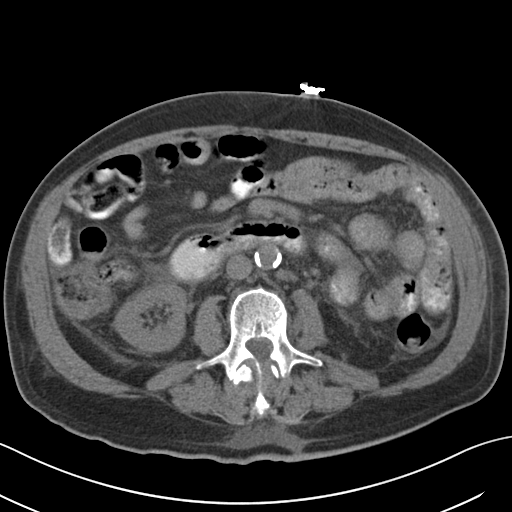
[im 62/93  soft-tissue]
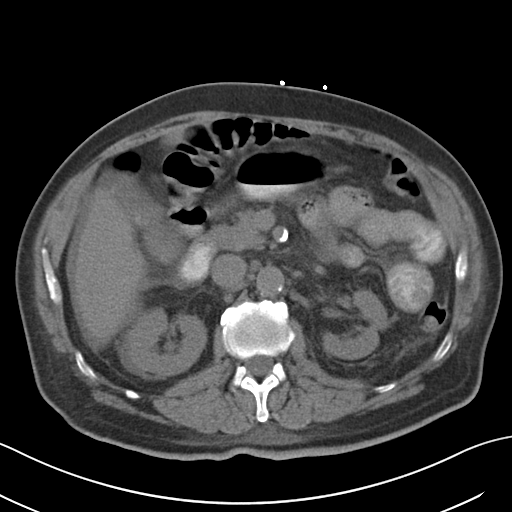
[im 71/93  soft-tissue]
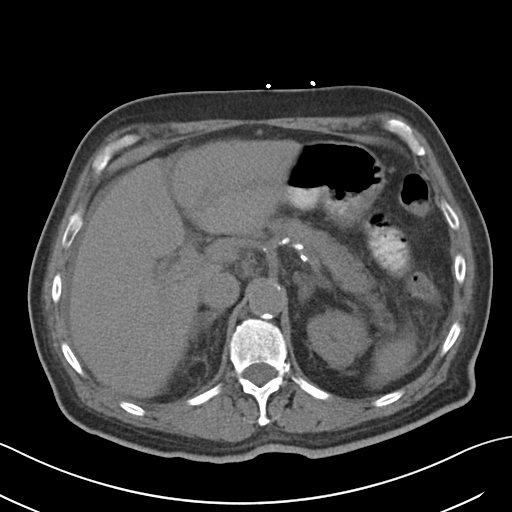
[im 71/93  bone]
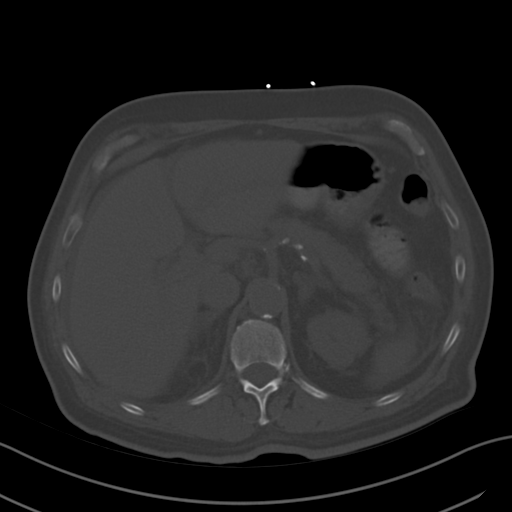
[im 79/93  soft-tissue]
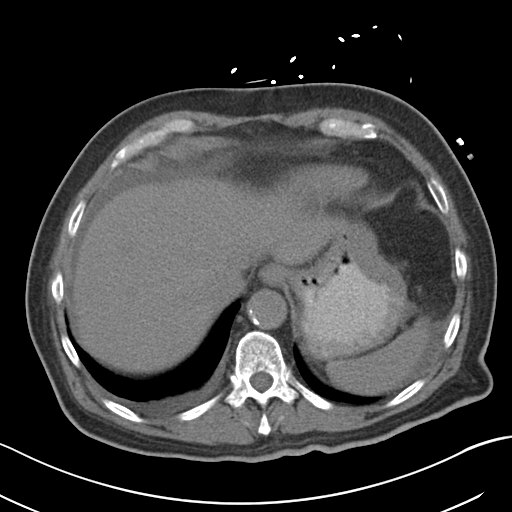
[im 88/93  soft-tissue]
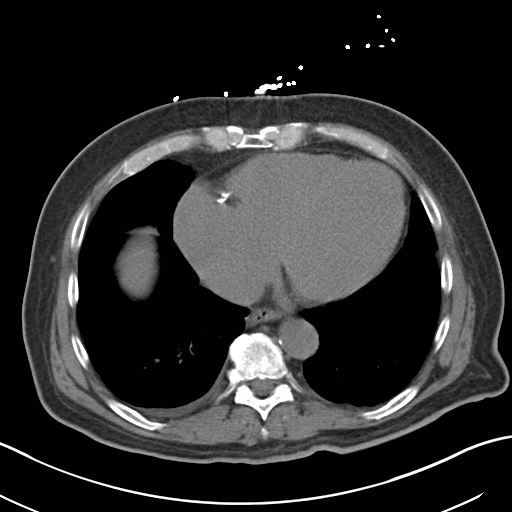

[Series 4: coronal · coronal · 0.68mm/px · 3 of 129 slices shown]
[im 43/129  soft-tissue]
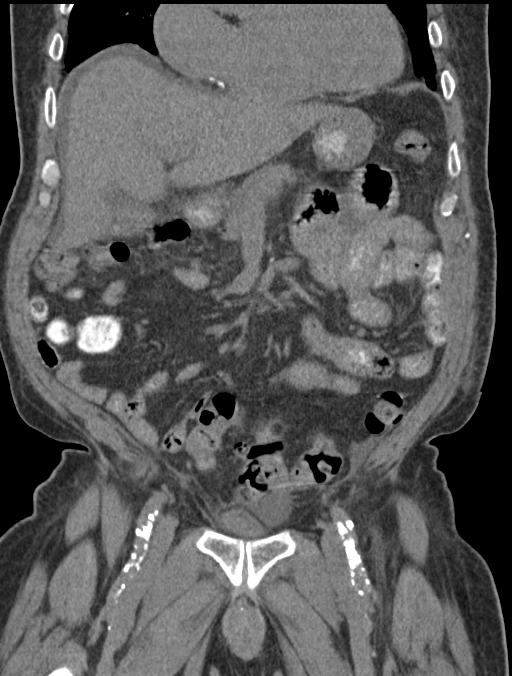
[im 57/129  soft-tissue]
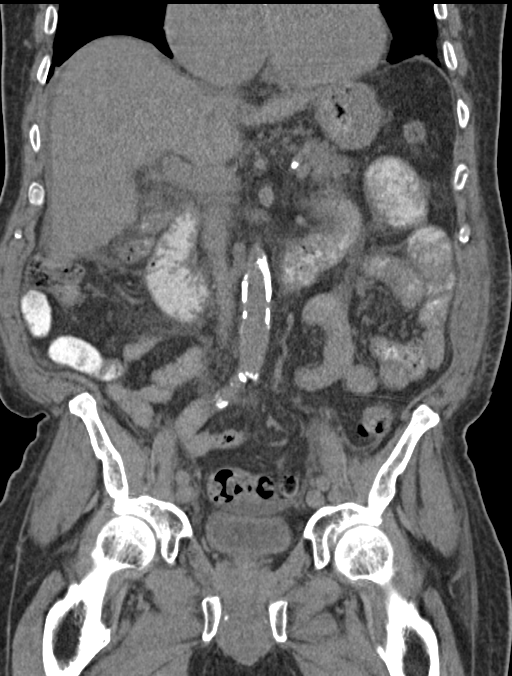
[im 72/129  soft-tissue]
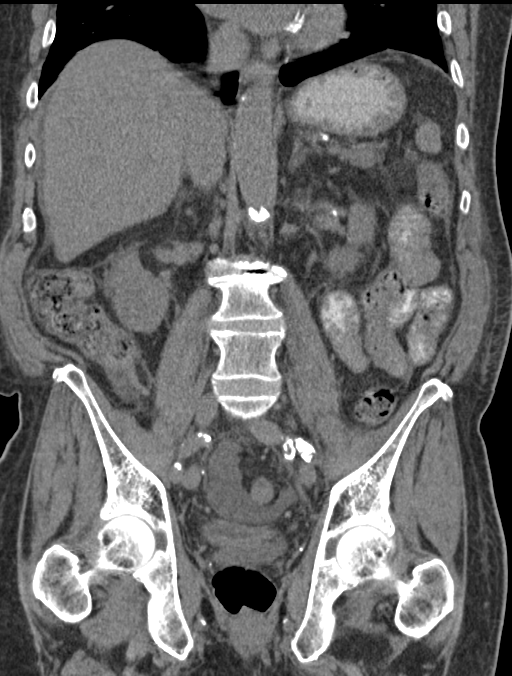

[14 of 46 positions shown; findings below may reference images not displayed]

FINDINGS: Evaluation of this exam is limited in the absence of intravenous
contrast.

Lower chest: There is moderate cardiomegaly with multi vessel
coronary vascular disease. There is hypoattenuation of the cardiac
blood pool suggestive of a degree of anemia. Clinical correlation is
recommended. Mild interlobular septal prominence and partially
visualized small right pleural effusion likely representing mild
interstitial edema. 1

No intra-abdominal free air. Diffuse mesenteric stranding and small
ascites.

Hepatobiliary: There is apparent minimal irregularity of the hepatic
contour with enlargement of the left lobe of the liver concerning
for early changes of cirrhosis. Correlation with clinical exam and
LFTs recommended. No intrahepatic biliary ductal dilatation. High
attenuating content within the gallbladder may represent sludge or
stones versus vicarious excretion of contrast from recent
intravenous injection.

Pancreas: Unremarkable. No pancreatic ductal dilatation or
surrounding inflammatory changes.

Spleen: Normal in size without focal abnormality.

Adrenals/Urinary Tract: A 1.7 x 2.4 cm left adrenal low attenuating
nodule with fat attenuation most consistent with an adenoma. A
smaller right adrenal adenoma versus thickening noted. There is a 7
mm nonobstructing right renal interpolar calculus. No
hydronephrosis. There is mild left renal parenchymal atrophy. There
is no hydronephrosis or nephrolithiasis on the left. The visualized
ureters appear unremarkable. The urinary bladder is only partially
distended and appears grossly unremarkable but

Stomach/Bowel: There is sigmoid diverticulosis without active
inflammatory changes. There is no evidence of bowel obstruction or
active inflammation. Normal appendix.

Vascular/Lymphatic: Advanced aortoiliac atherosclerotic disease. The
IVC is grossly unremarkable. No portal venous gas identified.
Evaluation of the vasculature is limited on this noncontrast study.
There is atherosclerotic calcification of the origin and mid portion
of the SMA. There is no adenopathy.

Reproductive: The prostate and seminal vesicles are grossly
unremarkable.

Other: Diffuse subcutaneous edema and anasarca. No fluid collection.
There is a small fat containing left inguinal hernia with extension
of small amount of ascitic fluid in the left inguinal canal.

Musculoskeletal: Degenerative changes of the spine. Bilateral L5
pars defects with grade II L5-S1 anterolisthesis. L2-L3 disc
desiccation with vacuum phenomena. Endplate irregularity and disc
space narrowing at L2-L3. There has been interval microdiskectomy
with removal of the previously seen extruded disc fragment at L2-L3.
No acute fracture.
IMPRESSION: 1. Enlargement of the left lobe of the liver concerning for early
changes of cirrhosis. Correlation with clinical exam and LFTs
recommended.
2. Gallbladder sludge versus small stones versus vicarious excretion
of intravenous contrast.
3. A 7 mm nonobstructing right renal stone. No hydronephrosis or
obstructing calculus.
4. Small ascites, diffuse mesenteric edema, and anasarca.
5. Sigmoid diverticulosis. No evidence of bowel obstruction or
active inflammation. Normal appendix.
6. Aortoiliac atherosclerotic disease.
7. Cardiomegaly with multi vessel coronary vascular disease.
8. Partially visualized small right pleural effusion with findings
concerning for mild interstitial edema.
9. Multilevel degenerative changes of the spine. There has been
interval microdiskectomy with removal of extruded disc fragment at
L2-3.
10. Grade 2 L5-S1 anterolisthesis with associated bilateral neural
foramina stenosis.
